# Patient Record
Sex: Male | Born: 1985 | Race: Black or African American | Hispanic: No | Marital: Married | Smoking: Never smoker
Health system: Southern US, Community
[De-identification: ages and names within clinical notes are randomized; demographics above are authoritative.]

## PROBLEM LIST (undated history)

## (undated) DIAGNOSIS — I10 Essential (primary) hypertension: Secondary | ICD-10-CM

## (undated) DIAGNOSIS — J45909 Unspecified asthma, uncomplicated: Secondary | ICD-10-CM

## (undated) HISTORY — DX: Unspecified asthma, uncomplicated: J45.909

## (undated) HISTORY — PX: MOUTH SURGERY: SHX715

---
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
## 2005-02-17 ENCOUNTER — Encounter
Admission: RE | Admit: 2005-02-17 | Discharge: 2005-05-18 | Payer: Self-pay | Admitting: Physical Medicine & Rehabilitation

## 2005-02-21 ENCOUNTER — Ambulatory Visit: Payer: Self-pay | Admitting: Physical Medicine & Rehabilitation

## 2005-05-23 ENCOUNTER — Encounter
Admission: RE | Admit: 2005-05-23 | Discharge: 2005-08-21 | Payer: Self-pay | Admitting: Physical Medicine & Rehabilitation

## 2005-05-23 ENCOUNTER — Ambulatory Visit: Payer: Self-pay | Admitting: Physical Medicine & Rehabilitation
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
## 2006-02-22 ENCOUNTER — Emergency Department (HOSPITAL_COMMUNITY): Admission: EM | Admit: 2006-02-22 | Discharge: 2006-02-22 | Payer: Self-pay | Admitting: Emergency Medicine

## 2006-02-24 ENCOUNTER — Emergency Department (HOSPITAL_COMMUNITY): Admission: EM | Admit: 2006-02-24 | Discharge: 2006-02-24 | Payer: Self-pay | Admitting: Emergency Medicine

## 2006-03-09 ENCOUNTER — Emergency Department (HOSPITAL_COMMUNITY): Admission: EM | Admit: 2006-03-09 | Discharge: 2006-03-09 | Payer: Self-pay | Admitting: Emergency Medicine

## 2007-01-10 DIAGNOSIS — J309 Allergic rhinitis, unspecified: Secondary | ICD-10-CM | POA: Insufficient documentation

## 2007-01-10 DIAGNOSIS — M7520 Bicipital tendinitis, unspecified shoulder: Secondary | ICD-10-CM

## 2007-01-10 DIAGNOSIS — L708 Other acne: Secondary | ICD-10-CM | POA: Insufficient documentation

## 2007-07-19 ENCOUNTER — Encounter: Admission: RE | Admit: 2007-07-19 | Discharge: 2007-07-19 | Payer: Self-pay | Admitting: Family Medicine

## 2007-10-13 ENCOUNTER — Emergency Department (HOSPITAL_COMMUNITY): Admission: EM | Admit: 2007-10-13 | Discharge: 2007-10-13 | Payer: Self-pay | Admitting: Emergency Medicine

## 2008-01-23 ENCOUNTER — Emergency Department (HOSPITAL_COMMUNITY): Admission: EM | Admit: 2008-01-23 | Discharge: 2008-01-23 | Payer: Self-pay | Admitting: Emergency Medicine

## 2008-02-02 ENCOUNTER — Emergency Department (HOSPITAL_COMMUNITY): Admission: EM | Admit: 2008-02-02 | Discharge: 2008-02-02 | Payer: Self-pay | Admitting: Family Medicine

## 2008-06-27 ENCOUNTER — Emergency Department (HOSPITAL_COMMUNITY): Admission: EM | Admit: 2008-06-27 | Discharge: 2008-06-28 | Payer: Self-pay | Admitting: Emergency Medicine

## 2008-09-10 ENCOUNTER — Emergency Department (HOSPITAL_COMMUNITY): Admission: EM | Admit: 2008-09-10 | Discharge: 2008-09-11 | Payer: Self-pay | Admitting: Emergency Medicine

## 2009-04-23 ENCOUNTER — Emergency Department (HOSPITAL_COMMUNITY): Admission: EM | Admit: 2009-04-23 | Discharge: 2009-04-24 | Payer: Self-pay | Admitting: Emergency Medicine

## 2009-09-23 ENCOUNTER — Emergency Department (HOSPITAL_COMMUNITY): Admission: EM | Admit: 2009-09-23 | Discharge: 2009-09-24 | Payer: Self-pay | Admitting: Emergency Medicine

## 2009-09-27 ENCOUNTER — Emergency Department (HOSPITAL_COMMUNITY): Admission: EM | Admit: 2009-09-27 | Discharge: 2009-09-27 | Payer: Self-pay | Admitting: Family Medicine

## 2010-02-15 ENCOUNTER — Emergency Department (HOSPITAL_COMMUNITY): Admission: EM | Admit: 2010-02-15 | Discharge: 2010-02-16 | Payer: Self-pay | Admitting: Emergency Medicine

## 2010-12-04 ENCOUNTER — Encounter: Payer: Self-pay | Admitting: Physical Medicine & Rehabilitation

## 2010-12-18 ENCOUNTER — Ambulatory Visit (HOSPITAL_COMMUNITY)
Admission: RE | Admit: 2010-12-18 | Discharge: 2010-12-18 | Disposition: A | Discharge status: Home / Self Care | Payer: Self-pay | Source: Ambulatory Visit | Attending: Family Medicine | Admitting: Family Medicine

## 2010-12-18 ENCOUNTER — Inpatient Hospital Stay (INDEPENDENT_AMBULATORY_CARE_PROVIDER_SITE_OTHER)
Admission: RE | Admit: 2010-12-18 | Discharge: 2010-12-18 | Disposition: A | Discharge status: Home / Self Care | Payer: Self-pay | Source: Ambulatory Visit | Attending: Family Medicine | Admitting: Family Medicine

## 2010-12-18 DIAGNOSIS — R071 Chest pain on breathing: Secondary | ICD-10-CM

## 2010-12-18 DIAGNOSIS — R079 Chest pain, unspecified: Secondary | ICD-10-CM | POA: Insufficient documentation

## 2012-05-08 ENCOUNTER — Emergency Department (HOSPITAL_COMMUNITY)
Admission: EM | Admit: 2012-05-08 | Discharge: 2012-05-08 | Disposition: A | Discharge status: Home / Self Care | Payer: Self-pay | Attending: Emergency Medicine | Admitting: Emergency Medicine

## 2012-05-08 ENCOUNTER — Encounter (HOSPITAL_COMMUNITY): Payer: Self-pay | Admitting: Emergency Medicine

## 2012-05-08 DIAGNOSIS — R51 Headache: Secondary | ICD-10-CM | POA: Insufficient documentation

## 2012-05-08 DIAGNOSIS — I10 Essential (primary) hypertension: Secondary | ICD-10-CM | POA: Insufficient documentation

## 2012-05-08 HISTORY — DX: Essential (primary) hypertension: I10

## 2012-05-08 MED ORDER — KETOROLAC TROMETHAMINE 60 MG/2ML IM SOLN
60.0000 mg | Freq: Once | INTRAMUSCULAR | Status: AC
  Administered 2012-05-08: 30 mg via INTRAMUSCULAR
  Filled 2012-05-08: qty 2

## 2012-05-08 MED ORDER — METOCLOPRAMIDE HCL 5 MG/ML IJ SOLN
10.0000 mg | Freq: Once | INTRAMUSCULAR | Status: AC
  Administered 2012-05-08: 10 mg via INTRAVENOUS
  Filled 2012-05-08: qty 2

## 2012-05-08 MED ORDER — DIPHENHYDRAMINE HCL 25 MG PO CAPS
25.0000 mg | ORAL_CAPSULE | Freq: Once | ORAL | Status: AC
  Administered 2012-05-08: 25 mg via ORAL
  Filled 2012-05-08: qty 1

## 2012-05-08 MED ORDER — IBUPROFEN 600 MG PO TABS: 600.0000 mg | ORAL_TABLET | Freq: Four times a day (QID) | ORAL | Status: AC | PRN

## 2012-05-08 NOTE — ED Notes (Signed)
Pt states he has had a headache for the past 4 days and states his blood pressure has been fluctuating up and down  Pt states this morning his headache is on the right side around the temporal area making it hard to focus   Pt states he used to take lisinopril for his blood pressure but has not taken it for about 9 mths now  Pt states he quit taking it because he never got the prescription refilled

## 2012-05-08 NOTE — Discharge Instructions (Signed)
Your blood pressure here is normal. Take ibuprofen for pain at home. Make sure you drink plenty of fluids, rest. Follow up with  A primary care doctor or an urgent care for recheck. Return if changes in vision, worsening headache, nausea, vomiting, fever, or any new concerning symptom. You can also try to take zytrec and sudafed for possible sinus congestion  General Headache, Without Cause A general headache has no specific cause. These headaches are not life-threatening. They will not lead to other types of headaches. HOME CARE   Make and keep follow-up visits with your doctor.   Only take medicine as told by your doctor.   Try to relax, get a massage, or use your thoughts to control your body (biofeedback).   Apply cold or heat to the head and neck. Apply 3 or 4 times a day or as needed.  Finding out the results of your test Ask when your test results will be ready. Make sure you get your test results. GET HELP RIGHT AWAY IF:   You have problems with medicine.   Your medicine does not help relieve pain.   Your headache changes or becomes worse.   You feel sick to your stomach (nauseous) or throw up (vomit).   You have a temperature by mouth above 102 F (38.9 C), not controlled by medicine.   Your have a stiff neck.   You have vision loss.   You have muscle weakness.   You lose control of your muscles.   You lose balance or have trouble walking.   You feel like you are going to pass out (faint).  MAKE SURE YOU:   Understand these instructions.   Will watch this condition.   Will get help right away if you are not doing well or get worse.  Document Released: 08/08/2008 Document Revised: 10/19/2011 Document Reviewed: 08/08/2008 [facility] Patient Information 2012 [place], [place].

## 2012-05-08 NOTE — ED Provider Notes (Signed)
History/physical exam/procedure(s) were performed by non-physician practitioner and as supervising physician I was immediately available for consultation/collaboration. I have reviewed all notes and am in agreement with care and plan.   Hilario Quarry, MD 05/08/12 1031

## 2012-05-08 NOTE — ED Notes (Signed)
Report received-airway intact-no s/s's of distress-will continue to monitor 

## 2012-05-08 NOTE — ED Provider Notes (Signed)
History     CSN: [id number]  Arrival date & time 05/08/12  7829   First MD Initiated Contact with Patient 05/08/12 [phone]      Chief Complaint  Patient presents with  . Headache    (Consider location/radiation/quality/duration/timing/severity/associated sxs/prior treatment) Patient is a 26 y.o. male presenting with headaches. The history is provided by the patient.  Headache  This is a new problem. The current episode started more than 2 days ago. The problem occurs constantly. The headache is associated with bright light and loud noise. The pain is located in the right unilateral region. The quality of the pain is described as sharp. The pain is at a severity of 7/10. The pain does not radiate. Pertinent negatives include no fever, no nausea and no vomiting. He has tried nothing for the symptoms.  Pt with pain in the right temple for 4 days. States worsening. States "it feels difficult to focus." Denies visual changes in the right eye. States he is sensitive to light, sound. Denies nasal congestion. Denies head injury. Hx of headaches. States  "my blood pressure has been going up and down." Pt used to take lisinopril, but has been out for 9 months now.   Past Medical History  Diagnosis Date  . Hypertension     Past Surgical History  Procedure Date  . Mouth surgery     Family History  Problem Relation Age of Onset  . Hypertension Other   . Diabetes Other     History  Substance Use Topics  . Smoking status: Never Smoker   . Smokeless tobacco: Not on file  . Alcohol Use: Yes     occassional      Review of Systems  Constitutional: Negative for fever and chills.  HENT: Negative for ear pain, sore throat, neck pain and neck stiffness.   Eyes: Positive for photophobia. Negative for pain, discharge, redness and visual disturbance.  Respiratory: Negative.   Cardiovascular: Negative.   Gastrointestinal: Negative for nausea, vomiting and abdominal pain.  Skin: Negative.     Neurological: Positive for headaches. Negative for dizziness, weakness and numbness.    Allergies  Penicillins  Home Medications  No current outpatient prescriptions on file.  BP 130/73  Pulse 70  Temp 98.6 F (37 C) (Oral)  Resp 18  SpO2 98%  Physical Exam  Nursing note and vitals reviewed. Constitutional: He is oriented to person, place, and time. He appears well-developed and well-nourished. No distress.  HENT:  Head: Normocephalic and atraumatic.  Right Ear: External ear normal.  Left Ear: External ear normal.  Nose: Nose normal.  Mouth/Throat: Oropharynx is clear and moist.  Eyes: Conjunctivae and EOM are normal. Pupils are equal, round, and reactive to light.  Neck: Normal range of motion. Neck supple.  Cardiovascular: Normal rate, regular rhythm and normal heart sounds.   Pulmonary/Chest: Effort normal and breath sounds normal. No respiratory distress. He has no wheezes.  Musculoskeletal: Normal range of motion. He exhibits no edema.  Neurological: He is alert and oriented to person, place, and time.       Normal coordination, finger to nose, heel to shin. 5/5 and equal grip strength bilaterally  Skin: Skin is warm and dry.  Psychiatric: He has a normal mood and affect.    ED Course  Procedures (including critical care time)  Pt is a healthy 25yo with right sided headache. His vs are normal. Neurologically intact. No visual changes. Given toradol, reglan, benadryl with improvement. He is pain free. Suspect  migraine vs tension headache. Will d/c home with close follow up.   Filed Vitals:   05/08/12 0701  BP: 130/73  Pulse: 70  Temp: 98.6 F (37 C)  Resp: 18     1. Headache       MDM         Lottie Mussel, PA 05/08/12 [phone]

## 2012-12-17 ENCOUNTER — Emergency Department (HOSPITAL_COMMUNITY)
Admission: EM | Admit: 2012-12-17 | Discharge: 2012-12-17 | Disposition: A | Discharge status: Home / Self Care | Payer: Self-pay | Attending: Emergency Medicine | Admitting: Emergency Medicine

## 2012-12-17 ENCOUNTER — Encounter (HOSPITAL_COMMUNITY): Payer: Self-pay | Admitting: Emergency Medicine

## 2012-12-17 DIAGNOSIS — K029 Dental caries, unspecified: Secondary | ICD-10-CM | POA: Insufficient documentation

## 2012-12-17 DIAGNOSIS — I10 Essential (primary) hypertension: Secondary | ICD-10-CM | POA: Insufficient documentation

## 2012-12-17 MED ORDER — HYDROCODONE-ACETAMINOPHEN 5-325 MG PO TABS: 1.0000 | ORAL_TABLET | Freq: Four times a day (QID) | ORAL | Status: DC | PRN

## 2012-12-17 MED ORDER — CLINDAMYCIN HCL 150 MG PO CAPS: 150.0000 mg | ORAL_CAPSULE | Freq: Four times a day (QID) | ORAL | Status: DC

## 2012-12-17 NOTE — ED Notes (Signed)
The patient is AOx4 and comfortable with her discharge instructions. 

## 2012-12-17 NOTE — ED Notes (Signed)
Patient complaining of right lower dental pain that started on Saturday.  Last dental visit about a year and a half ago.  Reports swelling at gum line.

## 2012-12-17 NOTE — ED Provider Notes (Signed)
History     CSN: [id number]  Arrival date & time 12/17/12  0211   First MD Initiated Contact with Patient 12/17/12 [phone]      Chief Complaint  Patient presents with  . Dental Pain    (Consider location/radiation/quality/duration/timing/severity/associated sxs/prior treatment) Patient is a 27 y.o. male presenting with tooth pain. The history is provided by the patient. No language interpreter was used.  Dental PainPrimary symptoms do not include fever. The symptoms began 5 to 7 days ago. The symptoms are worsening. The symptoms are recurrent. The symptoms occur constantly.  Additional symptoms include: dental sensitivity to temperature. Additional symptoms do not include: facial swelling and trouble swallowing. Medical issues do not include: smoking.    Past Medical History  Diagnosis Date  . Hypertension     Past Surgical History  Procedure Date  . Mouth surgery     Family History  Problem Relation Age of Onset  . Hypertension Other   . Diabetes Other     History  Substance Use Topics  . Smoking status: Never Smoker   . Smokeless tobacco: Not on file  . Alcohol Use: Yes     Comment: occassional      Review of Systems  Constitutional: Negative for fever.  HENT: Negative for facial swelling, trouble swallowing and neck pain.   All other systems reviewed and are negative.    Allergies  Penicillins  Home Medications  No current outpatient prescriptions on file.  BP 155/97  Pulse 67  Temp 97.8 F (36.6 C) (Oral)  Resp 18  Ht 6' (1.829 m)  Wt 227 lb 8 oz (103.193 kg)  BMI 30.85 kg/m2  SpO2 100%  Physical Exam  Constitutional: He is oriented to person, place, and time. He appears well-developed and well-nourished. No distress.  HENT:  Head: Normocephalic and atraumatic.  Mouth/Throat: Oropharynx is clear and moist.    Eyes: Conjunctivae normal are normal. Pupils are equal, round, and reactive to light.  Neck: Neck supple.  Cardiovascular: Normal  rate and regular rhythm.   Pulmonary/Chest: Effort normal and breath sounds normal. He has no wheezes. He has no rales.  Abdominal: Soft. Bowel sounds are normal. There is no tenderness.  Musculoskeletal: Normal range of motion.  Neurological: He is alert and oriented to person, place, and time.  Skin: Skin is warm.  Psychiatric: He has a normal mood and affect.    ED Course  Procedures (including critical care time)  Labs Reviewed - No data to display No results found.   No diagnosis found.    MDM  Will prescribe clindamycin and pain medication call dentist within 2 days to be seen       Donald Memoli K Samyukta Cura-Rasch, MD 12/17/12 5621

## 2012-12-17 NOTE — ED Notes (Signed)
Advised the patient that, per Dr. Nicanor Alcon, the patient's receive a discount (n.o.s.) from the dentists to whom we referred the patient if the patient sees the D.D.S. W/in 48 hours.

## 2013-04-13 ENCOUNTER — Emergency Department (HOSPITAL_COMMUNITY)
Admission: EM | Admit: 2013-04-13 | Discharge: 2013-04-13 | Disposition: A | Discharge status: Home / Self Care | Payer: Self-pay | Attending: Emergency Medicine | Admitting: Emergency Medicine

## 2013-04-13 ENCOUNTER — Encounter (HOSPITAL_COMMUNITY): Payer: Self-pay | Admitting: *Deleted

## 2013-04-13 DIAGNOSIS — R21 Rash and other nonspecific skin eruption: Secondary | ICD-10-CM

## 2013-04-13 DIAGNOSIS — I10 Essential (primary) hypertension: Secondary | ICD-10-CM | POA: Insufficient documentation

## 2013-04-13 DIAGNOSIS — N4889 Other specified disorders of penis: Secondary | ICD-10-CM | POA: Insufficient documentation

## 2013-04-13 DIAGNOSIS — Z880 Allergy status to penicillin: Secondary | ICD-10-CM | POA: Insufficient documentation

## 2013-04-13 MED ORDER — HYDROCORTISONE 1 % EX CREA: TOPICAL_CREAM | CUTANEOUS | Status: DC

## 2013-04-13 MED ORDER — CLOTRIMAZOLE 1 % EX CREA: TOPICAL_CREAM | CUTANEOUS | Status: DC

## 2013-04-13 NOTE — ED Notes (Signed)
Pt states that he has discoloration, and irritation on his penis. He used [place], summer eve on it

## 2013-04-13 NOTE — ED Provider Notes (Signed)
History    This chart was scribed for non-physician practitioner, Lottie Mussel, PA-C, working with Geoffery Lyons, MD by Melba Coon, ED Scribe. This patient was seen in room TR10C/TR10C and the patient's care was started at 10:41PM.  CSN: [id number]  Arrival date & time 04/13/13  2214   None     Chief Complaint  Patient presents with  . Penis Pain    (Consider location/radiation/quality/duration/timing/severity/associated sxs/prior treatment) The history is provided by the patient. No language interpreter was used.   HPI Comments: Rodney Nunez is a 27 y.o. male who presents to the Emergency Department complaining of constant, moderate to severe, irritating penile pain with discoloration and sores to the penis without swelling with an onset 2 days ago. He reports he used [place] and summer eve soap, and since then has reported penile irritation. He has not tried anything to alleviate the pain. He has no prior history of STDs and does not think his sexual partner has an STD. He has never had these type of symptoms before. He denies pruritis. No abnormal urinary symptoms noted. No other pertinent medical symptoms.  Past Medical History  Diagnosis Date  . Hypertension     Past Surgical History  Procedure Laterality Date  . Mouth surgery      Family History  Problem Relation Age of Onset  . Hypertension Other   . Diabetes Other     History  Substance Use Topics  . Smoking status: Never Smoker   . Smokeless tobacco: Not on file  . Alcohol Use: Yes     Comment: occassional    Review of Systems  Constitutional: Negative for fever and diaphoresis.  HENT: Negative for neck pain and neck stiffness.   Eyes: Negative for visual disturbance.  Respiratory: Negative for apnea, chest tightness and shortness of breath.   Cardiovascular: Negative for chest pain and palpitations.  Gastrointestinal: Negative for nausea, vomiting, diarrhea and constipation.  Genitourinary:  Positive for genital sores and penile pain. Negative for dysuria, urgency, frequency, hematuria, decreased urine volume, discharge, penile swelling, scrotal swelling, difficulty urinating and testicular pain.  Musculoskeletal: Negative for gait problem.  Skin: Negative for rash.  Neurological: Negative for dizziness, weakness, light-headedness, numbness and headaches.  All other systems reviewed and are negative.    Allergies  Penicillins  Home Medications  No current outpatient prescriptions on file.  BP 150/98  Pulse 85  Temp(Src) 99.1 F (37.3 C) (Oral)  Resp 16  SpO2 98%  Physical Exam  Nursing note and vitals reviewed. Constitutional: He is oriented to person, place, and time. He appears well-developed and well-nourished. No distress.  HENT:  Head: Normocephalic.  Eyes: Conjunctivae are normal.  Neck: Normal range of motion. No tracheal deviation present.  Cardiovascular: Normal rate.   Pulmonary/Chest: Effort normal. No respiratory distress.  Abdominal: Soft. There is no tenderness.  Genitourinary: No penile tenderness.  Multiple scaly non tender papules over the shaft of the penis without vesicles, drainage, or penile d/c.  Musculoskeletal: Normal range of motion.  Neurological: He is oriented to person, place, and time.  Skin: Skin is warm. Rash noted. He is not diaphoretic.  Psychiatric: He has a normal mood and affect. His behavior is normal.    ED Course  Procedures (including critical care time)  COORDINATION OF CARE:  10:45PM - Will test for chlamydia and syphilis. Advised to f/u with PCP or health dept if symptoms worsen if tests today are negative and get tested for herpes.   Labs  Reviewed - No data to display No results found.   1. Penile rash       MDM  Pt with dry scaly rash to the penis. Suspect tinea cruris. Rash is non tender, no vesicles, doubt herpes. Did swab for gc/chlamida, rpr. Home with follow up and lotrimin and hydrocortisone  cream.   Filed Vitals:   04/13/13 2221  BP: 150/98  Pulse: 85  Temp: 99.1 F (37.3 C)  TempSrc: Oral  Resp: 16  SpO2: 98%     I personally performed the services described in this documentation, which was scribed in my presence. The recorded information has been reviewed and is accurate.       Lottie Mussel, PA-C 04/14/13 [phone]

## 2013-04-14 LAB — GC/CHLAMYDIA PROBE AMP
CT Probe RNA: NEGATIVE
GC Probe RNA: NEGATIVE

## 2013-04-14 NOTE — ED Provider Notes (Signed)
Medical screening examination/treatment/procedure(s) were performed by non-physician practitioner and as supervising physician I was immediately available for consultation/collaboration.  Geoffery Lyons, MD 04/14/13 [phone]

## 2014-02-05 ENCOUNTER — Telehealth: Payer: Self-pay

## 2014-02-05 NOTE — Telephone Encounter (Signed)
Patient is requesting a release of care form.

## 2014-02-11 ENCOUNTER — Telehealth: Payer: Self-pay | Admitting: Physical Medicine & Rehabilitation

## 2014-02-11 NOTE — Telephone Encounter (Signed)
i cannot make any comment on "release to work" for a patient i last saw 3 years ago. [facility] can do a little research into my last note to see what i had to say back in 2012, as I surely don't!

## 2014-02-11 NOTE — Telephone Encounter (Signed)
Contacted patient to inform him that per Dr. Riley KillSwartz he can not make any comment on releasing him to work because it has been 3 years since he has seen him.  See last telephone encounter.

## 2014-02-11 NOTE — Telephone Encounter (Signed)
Contacted patient to see exactly why he needed to release of care form. He said he was trying to go to in [employer] and they requested it.

## 2014-02-11 NOTE — Telephone Encounter (Signed)
i last saw him 2012??????????is this serious??????????

## 2014-02-24 ENCOUNTER — Ambulatory Visit (INDEPENDENT_AMBULATORY_CARE_PROVIDER_SITE_OTHER): Payer: Managed Care, Other (non HMO) | Admitting: Internal Medicine

## 2014-02-24 ENCOUNTER — Encounter: Payer: Self-pay | Admitting: Internal Medicine

## 2014-02-24 VITALS — BP 124/80 | HR 60 | Temp 97.9°F | Ht 72.0 in | Wt 212.4 lb

## 2014-02-24 DIAGNOSIS — J45909 Unspecified asthma, uncomplicated: Secondary | ICD-10-CM

## 2014-02-24 NOTE — Progress Notes (Signed)
   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 Asthma.  02/24/2014 1st [facility] office visit/ Kyrian Stage  Chief Complaint  Patient presents with  . Pulmonary Consult    Self referral. Pt states has hx of asthma. Not currently having any respiratory co's. Pt needs PFT's and clearance to join the [employer].    Not limited by breathing from desired activities   No problems with uri's/ weather change/ spring fall/ no meds at all  No obvious other patterns in day to day or daytime variabilty or assoc chronic cough or cp or chest tightness, subjective wheeze overt sinus or hb symptoms. No unusual exp hx or h/o childhood pna/ asthma or knowledge of premature birth.  Sleeping ok without nocturnal  or early am exacerbation  of respiratory  c/o's or need for noct saba. Also denies any obvious fluctuation of symptoms with weather or environmental changes or other aggravating or alleviating factors except as outlined above   Current Medications, Allergies, Complete Past Medical History, Past Surgical History, Family History, and Social History were reviewed in [facility] electronic [facility].            Review of Systems  Constitutional: Negative for fever, chills, activity change, appetite change and unexpected weight change.  HENT: Negative for congestion, dental problem, postnasal drip, rhinorrhea, sneezing, sore throat, trouble swallowing and voice change.   Eyes: Negative for visual disturbance.  Respiratory: Negative for cough, choking and shortness of breath.   Cardiovascular: Negative for chest pain and leg swelling.  Gastrointestinal: Negative for nausea, vomiting and abdominal pain.  Genitourinary: Negative  for difficulty urinating.  Musculoskeletal: Negative for arthralgias.  Skin: Negative for rash.  Psychiatric/Behavioral: Negative for behavioral problems and confusion.       Objective:   Physical Exam  amb bm nad  Wt Readings from Last 3 Encounters:  02/24/14 212 lb 6.4 oz (96.344 kg)  12/17/12 227 lb 8 oz (103.193 kg)      HEENT: nl dentition, turbinates, and orophanx. Nl external ear canals without cough reflex   NECK :  without JVD/Nodes/TM/ nl carotid upstrokes bilaterally   LUNGS: no acc muscle use, clear to A and P bilaterally without cough on insp or exp maneuvers   CV:  RRR  no s3 or murmur or increase in P2, no edema   ABD:  soft and nontender with nl excursion in the supine position. No bruits or organomegaly, bowel sounds nl  MS:  warm without deformities, calf tenderness, cyanosis or clubbing  SKIN: warm and dry without lesions    NEURO:  alert, approp, no deficits          Assessment & Plan:

## 2014-02-24 NOTE — Assessment & Plan Note (Signed)
-   Spirometry 02/24/2014 technically wnl though non-specific changes in mid flows are present and computer reads as mild obstruction  No evidence of active asthma at present, may be prone to future events based on h/o childhood asthma and non-specific findings on spirometry but see no reason he couldn't go through basic training or serve in [employer]

## 2014-02-24 NOTE — Patient Instructions (Signed)
There is no evidence you have active asthma at present -  although you do have a history of child hood asthma so there's no way to prove you won't ever have it but in my opinion you should be able to do all physical activities required to go through training and serve in the [employer]

## 2014-05-16 ENCOUNTER — Encounter (HOSPITAL_COMMUNITY): Payer: Self-pay | Admitting: Emergency Medicine

## 2014-05-16 ENCOUNTER — Emergency Department (HOSPITAL_COMMUNITY)
Admission: EM | Admit: 2014-05-16 | Discharge: 2014-05-16 | Disposition: A | Discharge status: Home / Self Care | Payer: Managed Care, Other (non HMO) | Attending: Emergency Medicine | Admitting: Emergency Medicine

## 2014-05-16 DIAGNOSIS — Z79899 Other long term (current) drug therapy: Secondary | ICD-10-CM | POA: Insufficient documentation

## 2014-05-16 DIAGNOSIS — M545 Low back pain, unspecified: Secondary | ICD-10-CM | POA: Insufficient documentation

## 2014-05-16 DIAGNOSIS — Z791 Long term (current) use of non-steroidal anti-inflammatories (NSAID): Secondary | ICD-10-CM | POA: Insufficient documentation

## 2014-05-16 DIAGNOSIS — J45909 Unspecified asthma, uncomplicated: Secondary | ICD-10-CM | POA: Insufficient documentation

## 2014-05-16 DIAGNOSIS — Z880 Allergy status to penicillin: Secondary | ICD-10-CM | POA: Insufficient documentation

## 2014-05-16 DIAGNOSIS — I10 Essential (primary) hypertension: Secondary | ICD-10-CM | POA: Insufficient documentation

## 2014-05-16 MED ORDER — HYDROCODONE-ACETAMINOPHEN 5-325 MG PO TABS: 1.0000 | ORAL_TABLET | Freq: Four times a day (QID) | ORAL | Status: DC | PRN

## 2014-05-16 MED ORDER — KETOROLAC TROMETHAMINE 30 MG/ML IJ SOLN
30.0000 mg | Freq: Once | INTRAMUSCULAR | Status: AC
  Administered 2014-05-16: 30 mg via INTRAMUSCULAR
  Filled 2014-05-16: qty 1

## 2014-05-16 MED ORDER — IBUPROFEN 800 MG PO TABS: 800.0000 mg | ORAL_TABLET | Freq: Three times a day (TID) | ORAL | Status: DC

## 2014-05-16 MED ORDER — DIAZEPAM 5 MG PO TABS: 5.0000 mg | ORAL_TABLET | Freq: Two times a day (BID) | ORAL | Status: DC

## 2014-05-16 NOTE — ED Notes (Signed)
Pt c/o back spasms to lower back since this morning. Pt states he has had back pain for the past month. Pt ambulatory to exam room with steady gait.

## 2014-05-16 NOTE — ED Provider Notes (Signed)
CSN: [id number]     Arrival date & time 05/16/14  1519 History   First MD Initiated Contact with Patient 05/16/14 1537     Chief Complaint  Patient presents with  . Back Spasms       HPI  Patient p/w low back pain. Pain began approximately one month ago without clear precipitant. Initially the pain was mild, but over the past day has become more severe, crampy, bilateral, worse on the left. No no distal dysesthesia or weakness, no incontinence, fevers, chills, abdominal pain no urinary complaints.  No relief with Flexeril.    Past Medical History  Diagnosis Date  . Hypertension   . Asthma    Past Surgical History  Procedure Laterality Date  . Mouth surgery     Family History  Problem Relation Age of Onset  . Heart attack Father   . Hypertension Sister   . Hypertension Mother    History  Substance Use Topics  . Smoking status: Never Smoker   . Smokeless tobacco: Never Used  . Alcohol Use: Yes     Comment: occassional    Patient works lifting heavy objects.    Review of Systems  Constitutional:       Per HPI, otherwise negative  HENT:       Per HPI, otherwise negative  Respiratory:       Per HPI, otherwise negative  Cardiovascular:       Per HPI, otherwise negative  Gastrointestinal: Negative for vomiting.  Endocrine: Negative for polyuria.  Genitourinary:       Neg aside from HPI   Musculoskeletal:       Per HPI, otherwise negative  Skin: Negative.   Neurological: Positive for weakness and numbness. Negative for syncope.      Allergies  Penicillins  Home Medications   Prior to Admission medications   Medication Sig Start Date End Date Taking? Authorizing Provider  diazepam (VALIUM) 5 MG tablet Take 1 tablet (5 mg total) by mouth 2 (two) times daily. 05/16/14   Gerhard Munchobert Fenna Semel, MD  HYDROcodone-acetaminophen (NORCO/VICODIN) 5-325 MG per tablet Take 1 tablet by mouth every 6 (six) hours as needed for severe pain. 05/16/14   Gerhard Munchobert Jernie Schutt, MD   ibuprofen (ADVIL,MOTRIN) 800 MG tablet Take 1 tablet (800 mg total) by mouth 3 (three) times daily. 05/16/14   Gerhard Munchobert Tyreona Panjwani, MD   BP 118/72  Pulse 58  Temp(Src) 97.9 F (36.6 C) (Oral)  Resp 16  SpO2 98% Physical Exam  Nursing note and vitals reviewed. Constitutional: He is oriented to person, place, and time. He appears well-developed. No distress.  HENT:  Head: Normocephalic and atraumatic.  Eyes: Conjunctivae and EOM are normal.  Cardiovascular: Normal rate and regular rhythm.   Pulmonary/Chest: Effort normal. No stridor. No respiratory distress.  Abdominal: He exhibits no distension.  Musculoskeletal: He exhibits no edema.  Neurological: He is alert and oriented to person, place, and time. No cranial nerve deficit. He exhibits normal muscle tone. Coordination normal.  Skin: Skin is warm and dry.  Psychiatric: He has a normal mood and affect.    ED Course  Procedures (including critical care time)  MDM   Final diagnoses:  Low back pain at multiple sites   well-appearing male with no neurologic deficits presents with worsening low back pain.  Patient was discharged in stable condition after initiation of analgesic regimen.      Gerhard Munchobert Pharrah Rottman, MD 05/16/14 [phone]

## 2014-05-16 NOTE — Discharge Instructions (Signed)
As discussed, your evaluation today has been largely reassuring.  But, it is important that you monitor your condition carefully, and do not hesitate to return to the ED if you develop new, or concerning changes in your condition. ? ?Otherwise, please follow-up with your physician for appropriate ongoing care. ? ?

## 2014-10-13 ENCOUNTER — Ambulatory Visit (INDEPENDENT_AMBULATORY_CARE_PROVIDER_SITE_OTHER): Payer: Managed Care, Other (non HMO)

## 2014-10-13 ENCOUNTER — Ambulatory Visit (INDEPENDENT_AMBULATORY_CARE_PROVIDER_SITE_OTHER): Payer: Managed Care, Other (non HMO) | Admitting: Family Medicine

## 2014-10-13 VITALS — BP 134/82 | HR 61 | Temp 98.9°F | Resp 16 | Ht 70.75 in | Wt 211.0 lb

## 2014-10-13 DIAGNOSIS — M5440 Lumbago with sciatica, unspecified side: Secondary | ICD-10-CM

## 2014-10-13 DIAGNOSIS — R202 Paresthesia of skin: Secondary | ICD-10-CM

## 2014-10-13 DIAGNOSIS — Z1329 Encounter for screening for other suspected endocrine disorder: Secondary | ICD-10-CM

## 2014-10-13 DIAGNOSIS — M542 Cervicalgia: Secondary | ICD-10-CM

## 2014-10-13 DIAGNOSIS — Z131 Encounter for screening for diabetes mellitus: Secondary | ICD-10-CM

## 2014-10-13 LAB — POCT CBC
Granulocyte percent: 47.7 %G (ref 37–80)
HCT, POC: 44.5 % (ref 43.5–53.7)
Hemoglobin: 14.8 g/dL (ref 14.1–18.1)
Lymph, poc: 1.8 (ref 0.6–3.4)
MCH, POC: 28.9 pg (ref 27–31.2)
MCHC: 33.3 g/dL (ref 31.8–35.4)
MCV: 87 fL (ref 80–97)
MID (cbc): 0.3 (ref 0–0.9)
MPV: 8.1 fL (ref 0–99.8)
POC Granulocyte: 1.9 — AB (ref 2–6.9)
POC LYMPH PERCENT: 45.3 %L (ref 10–50)
POC MID %: 7 %M (ref 0–12)
Platelet Count, POC: 269 10*3/uL (ref 142–424)
RBC: 5.11 M/uL (ref 4.69–6.13)
RDW, POC: 13.2 %
WBC: 3.9 10*3/uL — AB (ref 4.6–10.2)

## 2014-10-13 LAB — POCT GLYCOSYLATED HEMOGLOBIN (HGB A1C): Hemoglobin A1C: 5.2

## 2014-10-13 MED ORDER — TIZANIDINE HCL 4 MG PO TABS: 4.0000 mg | ORAL_TABLET | Freq: Three times a day (TID) | ORAL | Status: DC | PRN

## 2014-10-13 MED ORDER — IBUPROFEN 800 MG PO TABS: 800.0000 mg | ORAL_TABLET | Freq: Three times a day (TID) | ORAL | Status: DC | PRN

## 2014-10-13 NOTE — Progress Notes (Signed)
Chief Complaint:  Chief Complaint  Patient presents with  . Numbness    Bilateral Legs & Feet     HPI: Rodney Nunez is a 28 y.o. male who is here for  Bilateral feet numbness and tingling Has had no fevers or chills, denies diabetes or nay msk abnormalities Sometimes has it in his hands as well, there is no discolorations, no weather dependent When he is standing for a long time, when he is sitting for a long time the numbness and tingling will come on  When he is laying down it is worse  His mom has DM and thyroid disease. He has been  Urinating more, he has been drinking sodas to keep his energy level and sleep deprivation at bay He has intentionally lost weight. 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  He is on his feet all the time. 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   Thursday through Sunday 7-8  hours of sleep, MOnday -Wednesday 4 hours of sleep Weekends he sleeps normally Thursday through Sunday  He sleeps about 4 hours.   Wt Readings from Last 3 Encounters:  10/13/14 211 lb (95.709 kg)  02/24/14 212 lb 6.4 oz (96.344 kg)  12/17/12 227 lb 8 oz (103.193 kg)     Past Medical History  Diagnosis Date  . Hypertension   . Asthma    Past Surgical History  Procedure Laterality Date  . Mouth surgery     History   Social History  . Marital Status: Single    Spouse Name: N/A    Number of Children: N/A  . Years of Education: N/A   Social History Main Topics  . Smoking status: Never Smoker   . Smokeless tobacco: Never Used  . Alcohol Use: Yes     Comment: occassional  . Drug Use: No  . Sexual Activity: None   Other Topics Concern  . None   Social History Narrative   Family History  Problem Relation Age of Onset  . Heart attack Father   . Heart  disease Father   . Hypertension Sister   . Hypertension Mother   . Diabetes Mother    Allergies  Allergen Reactions  . Penicillins Nausea And Vomiting   Prior to Admission medications   Medication Sig Start Date End Date Taking? Authorizing Provider  diazepam (VALIUM) 5 MG tablet Take 1 tablet (5 mg total) by mouth 2 (two) times daily. Patient not taking: Reported on 10/13/2014 05/16/14   Gerhard Munchobert Lockwood, MD  HYDROcodone-acetaminophen (NORCO/VICODIN) 5-325 MG per tablet Take 1 tablet by mouth every 6 (six) hours as needed for severe pain. Patient not taking: Reported on 10/13/2014 05/16/14   Gerhard Munchobert Lockwood, MD  ibuprofen (ADVIL,MOTRIN) 800 MG tablet Take 1 tablet (800 mg total) by mouth 3 (three) times daily. Patient not taking: Reported on 10/13/2014 05/16/14   Gerhard Munchobert Lockwood, MD  tiZANidine (ZANAFLEX) 4 MG tablet Take 4 mg by mouth every 8 (eight) hours as needed for muscle spasms.    Historical Provider, MD     ROS: The patient denies fevers, chills, night sweats, unintentional weight loss, chest pain, palpitations, wheezing, dyspnea on exertion, nausea, vomiting, abdominal pain, dysuria,  hematuria, melena,  All other systems have been reviewed and were otherwise negative with the exception of those mentioned in the HPI and as above.    PHYSICAL EXAM: Filed Vitals:   10/13/14 1854  BP: 134/82  Pulse: 61  Temp: 98.9 F (37.2 C)  Resp: 16   Filed Vitals:   10/13/14 1854  Height: 5' 10.75" (1.797 m)  Weight: 211 lb (95.709 kg)   Body mass index is 29.64 kg/(m^2).  General: Alert, no acute distress HEENT:  Normocephalic, atraumatic, oropharynx patent. EOMI, PERRLA. fundo exam normal Cardiovascular:  Regular rate and rhythm, no rubs murmurs or gallops.  No Carotid bruits, radial pulse intact. No pedal edema.  Respiratory: Clear to auscultation bilaterally.  No wheezes, rales, or rhonchi.  No cyanosis, no use of accessory musculature GI: No organomegaly, abdomen is soft and  non-tender, positive bowel sounds.  No masses. Skin: No rashes. Neurologic: Facial musculature symmetric. Psychiatric: Patient is appropriate throughout our interaction. Lymphatic: No cervical lymphadenopathy Musculoskeletal: Gait intact. neck exam normal except for +spurling, full ROM and strength Low back- +midline and  paramsk tenderness  tednerness of low back Full ROM 5/5 strength, 2/2 DTRs No saddle anesthesia Straight leg negative Hip and knee exam--normal    LABS: Results for orders placed or performed in visit on 10/13/14  POCT CBC  Result Value Ref Range   WBC 3.9 (A) 4.6 - 10.2 K/uL   Lymph, poc 1.8 0.6 - 3.4   POC LYMPH PERCENT 45.3 10 - 50 %L   MID (cbc) 0.3 0 - 0.9   POC MID % 7.0 0 - 12 %M   POC Granulocyte 1.9 (A) 2 - 6.9   Granulocyte percent 47.7 37 - 80 %G   RBC 5.11 4.69 - 6.13 M/uL   Hemoglobin 14.8 14.1 - 18.1 g/dL   HCT, POC 11.944.5 14.743.5 - 53.7 %   MCV 87.0 80 - 97 fL   MCH, POC 28.9 27 - 31.2 pg   MCHC 33.3 31.8 - 35.4 g/dL   RDW, POC 82.913.2 %   Platelet Count, POC 269 142 - 424 K/uL   MPV 8.1 0 - 99.8 fL  POCT glycosylated hemoglobin (Hb A1C)  Result Value Ref Range   Hemoglobin A1C 5.2      EKG/XRAY:   Primary [facility]. Neg fx or dislocation for c spine or l spine   ASSESSMENT/PLAN: Encounter Diagnoses  Name Primary?  . Paresthesia of left arm and leg Yes  . Paresthesia of right arm and leg   . Midline low back pain with sciatica, sciatica laterality unspecified   . Screening for thyroid disorder   . Screening for diabetes mellitus (DM)    Most likely repetitve motion, he is on he is feet all day  lifting boxes He will try ibuprofen He wiil be rx Flexeril to use pern  Labs pending F/u prn Work note given   Gross sideeffects, risk and benefits, and alternatives of medications d/w patient. Patient is aware that all medications have potential sideeffects and we are unable to predict every sideeffect or  drug-drug interaction that may occur.  Katha Kuehne PHUONG, DO 10/13/2014 8:40 PM

## 2014-10-14 LAB — TSH: TSH: 5.291 u[IU]/mL — ABNORMAL HIGH (ref 0.350–4.500)

## 2014-10-14 LAB — COMPLETE METABOLIC PANEL WITHOUT GFR
ALT: 20 U/L (ref 0–53)
AST: 29 U/L (ref 0–37)
Albumin: 4.6 g/dL (ref 3.5–5.2)
Alkaline Phosphatase: 51 U/L (ref 39–117)
BUN: 12 mg/dL (ref 6–23)
CO2: 30 meq/L (ref 19–32)
Calcium: 10 mg/dL (ref 8.4–10.5)
Chloride: 102 meq/L (ref 96–112)
Creat: 1.08 mg/dL (ref 0.50–1.35)
GFR, Est African American: >89 mL/min
GFR, Est Non African American: >89 mL/min
Glucose, Bld: 85 mg/dL (ref 70–99)
Potassium: 4.7 meq/L (ref 3.5–5.3)
Sodium: 140 meq/L (ref 135–145)
Total Bilirubin: 1.1 mg/dL (ref 0.2–1.2)
Total Protein: 7.4 g/dL (ref 6.0–8.3)

## 2014-10-20 ENCOUNTER — Telehealth: Payer: Self-pay | Admitting: Family Medicine

## 2014-10-20 ENCOUNTER — Encounter: Payer: Self-pay | Admitting: Family Medicine

## 2014-10-20 NOTE — Telephone Encounter (Signed)
Spoke with patient about labs, his TSH was slightly high, I would like him to come in in about 1 montha nd get it rechecked and also see if his aches and pains are gone, he states the medicines make him feel better.

## 2014-12-02 ENCOUNTER — Ambulatory Visit (INDEPENDENT_AMBULATORY_CARE_PROVIDER_SITE_OTHER): Payer: Managed Care, Other (non HMO) | Admitting: Family Medicine

## 2014-12-02 VITALS — BP 120/82 | HR 58 | Temp 97.4°F | Resp 18 | Ht 72.0 in | Wt 217.0 lb

## 2014-12-02 DIAGNOSIS — G43001 Migraine without aura, not intractable, with status migrainosus: Secondary | ICD-10-CM

## 2014-12-02 MED ORDER — HYDROCODONE-ACETAMINOPHEN 5-325 MG PO TABS: 1.0000 | ORAL_TABLET | Freq: Four times a day (QID) | ORAL | Status: DC | PRN

## 2014-12-02 NOTE — Progress Notes (Signed)
° °  Subjective:    Patient ID: Rodney FenderReginald D Tourville, male    DOB: 08-31-1986, 29 y.o.   MRN: [id number] This chart was scribed for Elvina SidleKurt Lauenstein, MD by Jolene Provostobert Halas, Medical Scribe. This patient was seen in Room 9 and the patient's care was started a 9:16 AM.  Chief Complaint  Patient presents with   Headache   Dizziness    HPI HPI Comments: Rodney Nunez is a 29 y.o. male with a past hx of HA who [facility] complaining of severe HA that started last night. Pt denies sinus congestion. Pt endorses dizziness, blurred vision. Pt denies photophobia, nausea, vomiting. Pt states he has not taken any medication.     Review of Systems  Constitutional: Negative for fever and chills.  Musculoskeletal: Negative for myalgias and arthralgias.  Neurological: Positive for dizziness and headaches. Negative for weakness and numbness.  Psychiatric/Behavioral: Positive for sleep disturbance.       Objective:   Physical Exam  Constitutional: He is oriented to person, place, and time. He appears well-developed and well-nourished.  HENT:  Head: Normocephalic and atraumatic.  Eyes: Pupils are equal, round, and reactive to light.  Neck: Neck supple.  Cardiovascular: Normal rate and regular rhythm.   Pulmonary/Chest: Effort normal and breath sounds normal. No respiratory distress.  Neurological: He is alert and oriented to person, place, and time.  Skin: Skin is warm and dry.  Psychiatric: He has a normal mood and affect. His behavior is normal.  Nursing note and vitals reviewed.  Fundi: Normal Neurological: Normal reflexes, moving 4 extremities equally, EOM intact, neck supple    Assessment & Plan:   This chart was scribed in my presence and reviewed by me personally.    ICD-9-CM ICD-10-CM   1. Migraine without aura and with status migrainosus, not intractable 346.12 G43.001 HYDROcodone-acetaminophen (NORCO) 5-325 MG per tablet     Signed, Elvina SidleKurt Lauenstein, MD

## 2014-12-02 NOTE — Patient Instructions (Signed)

## 2015-03-05 ENCOUNTER — Encounter: Payer: Self-pay | Admitting: Internal Medicine

## 2015-03-05 ENCOUNTER — Ambulatory Visit (INDEPENDENT_AMBULATORY_CARE_PROVIDER_SITE_OTHER): Payer: Managed Care, Other (non HMO) | Admitting: Internal Medicine

## 2015-03-05 ENCOUNTER — Encounter (INDEPENDENT_AMBULATORY_CARE_PROVIDER_SITE_OTHER): Payer: Self-pay

## 2015-03-05 DIAGNOSIS — J45909 Unspecified asthma, uncomplicated: Secondary | ICD-10-CM | POA: Diagnosis not present

## 2015-03-05 NOTE — Progress Notes (Signed)
Subjective:    Patient ID: Rodney Nunez, male    DOB: 1986/05/22   MRN: [id number]   Brief patient profile:  28 yobm never smoker  with childhood asthma used inhaler before exercise until 4th grade but nothing needed after that no trouble playing varsity football linebacker / full court basketball applying for [employer] so self referred 02/24/2014 for ? Asthma.   History of Present Illness  02/24/2014 [facility] visit/ [facility]  Chief Complaint  Patient presents with  . Pulmonary Consult    Self referral. Pt states has hx of asthma. Not currently having any respiratory co's. Pt needs PFT's and clearance to join the [employer].    Not limited by breathing from desired activities   No problems with uri's/ weather change/ spring fall/ no meds at all - out door bball ok  rec there is no evidence you have active asthma at present -  although you do have a history of child hood asthma so there's no way to prove you won't ever have it but in my opinion you should be able to do all physical activities required to go through training and serve in the [employer]   03/05/2015 f/u ov/[facility] re: reassessment re asthma Chief Complaint  Patient presents with  . Follow-up    Pt states needing breathing test done in order to join the [employer]. He states that his breathing is doing well and denies any co's today.      Can play outdoor sports in all conditions s cough /wheeze/ sob    No obvious day to day or daytime variabilty or assoc chronic cough or cp or chest tightness, subjective wheeze overt sinus or hb symptoms. No unusual exp hx or h/o childhood pna/ asthma or knowledge of premature birth.  Sleeping ok without nocturnal  or early am exacerbation  of respiratory  c/o's or need for noct saba. Also denies any obvious fluctuation of symptoms with weather or environmental changes or other aggravating or alleviating factors except as outlined above   Current Medications, Allergies, Complete Past  Medical History, Past Surgical History, Family History, and Social History were reviewed in [facility] electronic medical record.  ROS  The following are not active complaints unless bolded sore throat, dysphagia, dental problems, itching, sneezing,  nasal congestion or excess/ purulent secretions, ear ache,   fever, chills, sweats, unintended wt loss, pleuritic or exertional cp, hemoptysis,  orthopnea pnd or leg swelling, presyncope, palpitations, heartburn, abdominal pain, anorexia, nausea, vomiting, diarrhea  or change in bowel or urinary habits, change in stools or urine, dysuria,hematuria,  rash, arthralgias, visual complaints, headache, numbness weakness or ataxia or problems with walking or coordination,  change in mood/affect or memory.                      Objective:   Physical Exam  amb bm nad  Wt Readings from Last 3 Encounters:  03/05/15 230 lb (104.327 kg)  12/02/14 217 lb (98.431 kg)  10/13/14 211 lb (95.709 kg)    Vital signs reviewed       HEENT: nl dentition, turbinates, and orophanx. Nl external ear canals without cough reflex   NECK :  without JVD/Nodes/TM/ nl carotid upstrokes bilaterally   LUNGS: no acc muscle use, clear to A and P bilaterally without cough on insp or exp maneuvers   CV:  RRR  no s3 or murmur or increase in P2, no edema   ABD:  soft and nontender  with nl excursion in the supine position. No bruits or organomegaly, bowel sounds nl  MS:  warm without deformities, calf tenderness, cyanosis or clubbing  SKIN: warm and dry without lesions    NEURO:  alert, approp, no deficits          Assessment & Plan:

## 2015-03-05 NOTE — Patient Instructions (Signed)
Your test is consistent with someone who had asthma as a child with a fixed pattern of airfow obstruction but not active asthma  If more information is needed, you will need to schedule a CPST with spirometry before and after exercise to see if you have any asthma related to exertion - call Libby at [phone]

## 2015-03-06 ENCOUNTER — Encounter: Payer: Self-pay | Admitting: Internal Medicine

## 2015-03-06 NOTE — Assessment & Plan Note (Signed)
-   Spirometry 02/24/2014 technically wnl though non-specific changes in mid flows are present and computer reads as mild obstruction - Spirometry 03/05/15 min airflow obst  Clinically he has no asthma not with colds or ex in all conditions though his pfts do suggest min airflow obst and I do suspect he would go on to develop copd if smoked is not at this point active asthmatic  May do cpst to establish absence of asthma with exercise if needed.

## 2015-08-31 ENCOUNTER — Ambulatory Visit: Payer: Worker's Compensation

## 2015-08-31 ENCOUNTER — Ambulatory Visit (INDEPENDENT_AMBULATORY_CARE_PROVIDER_SITE_OTHER): Payer: Worker's Compensation | Admitting: Internal Medicine

## 2015-08-31 VITALS — BP 110/64 | HR 60 | Temp 97.7°F | Resp 16 | Ht 72.0 in | Wt 225.0 lb

## 2015-08-31 DIAGNOSIS — M25561 Pain in right knee: Secondary | ICD-10-CM | POA: Diagnosis not present

## 2015-08-31 DIAGNOSIS — M7041 Prepatellar bursitis, right knee: Secondary | ICD-10-CM

## 2015-08-31 MED ORDER — NAPROXEN SODIUM 550 MG PO TABS: 550.0000 mg | ORAL_TABLET | Freq: Two times a day (BID) | ORAL | Status: DC

## 2015-08-31 NOTE — Patient Instructions (Signed)
Apply ice, keep wrapped Take anaprox twice a day for 7-10 days. Do not use with other products containing ibuprofen, naprosyn or aspirin. You may use tylenol with this medication. Elevate leg when possible Light duty at work without bending or kneeling Return in 1 week for follow up.   RICE for Routine Care of Injuries Theroutine careofmanyinjuriesincludes rest, ice, compression, and elevation (RICE therapy). RICE therapy is often recommended for injuries to soft tissues, such as a muscle strain, ligament injuries, bruises, and overuse injuries. It can also be used for some bony injuries. Using RICE therapy can help to relieve pain, lessen swelling, and enable your body to heal. Rest Rest is required to allow your body to heal. This usually involves reducing your normal activities and avoiding use of the injured part of your body. Generally, you can return to your normal activities when you are comfortable and have been given permission by your health care provider. Ice Icing your injury helps to keep the swelling down, and it lessens pain. Do not apply ice directly to your skin.  Put ice in a plastic bag.  Place a towel between your skin and the bag.  Leave the ice on for 20 minutes, 2-3 times a day. Do this for as long as you are directed by your health care provider. Compression Compression means putting pressure on the injured area. Compression helps to keep swelling down, gives support, and helps with discomfort. Compression may be done with an elastic bandage. If an elastic bandage has been applied, follow these general tips:  Remove and reapply the bandage every 3-4 hours or as directed by your health care provider.  Make sure the bandage is not wrapped too tightly, because this can cut off circulation. If part of your body beyond the bandage becomes blue, numb, cold, swollen, or more painful, your bandage is most likely too tight. If this occurs, remove your bandage and reapply  it more loosely.  See your health care provider if the bandage seems to be making your problems worse rather than better. Elevation Elevation means keeping the injured area raised. This helps to lessen swelling and decrease pain. If possible, your injured area should be elevated at or above the level of your heart or the center of your chest. WHEN SHOULD I SEEK MEDICAL CARE? You should seek medical care if:  Your pain and swelling continue.  Your symptoms are getting worse rather than improving. These symptoms may indicate that further evaluation or further X-rays are needed. Sometimes, X-rays may not show a small broken bone (fracture) until a number of days later. Make a follow-up appointment with your health care provider. WHEN SHOULD I SEEK IMMEDIATE MEDICAL CARE? You should seek immediate medical care if:  You have sudden severe pain at or below the area of your injury.  You have redness or increased swelling around your injury.  You have tingling or numbness at or below the area of your injury that does not improve after you remove the elastic bandage.   This information is not intended to replace advice given to you by your health care provider. Make sure you discuss any questions you have with your health care provider.   Document Released: 02/11/2001 Document Revised: 07/21/2015 Document Reviewed: 10/07/2014 Elsevier Interactive Patient Education [employer]2016 Elsevier Inc.

## 2015-08-31 NOTE — Progress Notes (Signed)
[facility] [address], [place] [address] [phone]  Date:  08/31/2015   Name:  Rodney Nunez   DOB:  03/18/1986   MRN:  [id number]  PCP:  Temple PaciniBONSU, OSEI A, DO    Chief Complaint: Knee Injury   History of Present Illness:  This is a 29 y.o. male who is presenting with a right knee injury that occurred at work on 08/29/15. 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 He tripped over a piece of equipment and fell onto right anterior knee. He has had pain and swelling since. He is able to ambulate with slight pain. Swollen area is tender to touch. He denies redness of skin, fever, chills. He has never had anything like this before. He denies weakness or paresthesias.  Review of Systems:  Review of Systems See HPI  Prior to Admission medications   Not on File    Allergies  Allergen Reactions  . Penicillins Nausea And Vomiting    Medication list has been reviewed and updated.  Physical Examination:  Physical Exam  Constitutional: He is oriented to person, place, and time. He appears well-developed and well-nourished. No distress.  HENT:  Head: Normocephalic and atraumatic.  Right Ear: Hearing normal.  Left Ear: Hearing normal.  Nose: Nose normal.  Eyes: Conjunctivae and lids are normal. Right eye exhibits no discharge. Left eye exhibits no discharge. No scleral icterus.  Cardiovascular: Normal rate, regular rhythm and normal pulses.   Pulmonary/Chest: Effort normal. No respiratory distress.  Musculoskeletal: Normal range of motion.       Right knee: He exhibits swelling (3 cm fluctuance over inferior patellar tendon consistent with bursitis). He exhibits normal range of motion, no effusion, no ecchymosis, no laceration and no erythema. Tenderness found. Lateral joint line and patellar tendon tenderness noted. No medial joint line, no MCL and no LCL tenderness noted.       Left knee: Normal.  Neurological: He is alert and oriented to  person, place, and time. He has normal strength. No sensory deficit. Gait normal.  Skin: Skin is warm, dry and intact. No lesion and no rash noted.  Psychiatric: He has a normal mood and affect. His speech is normal and behavior is normal. Thought content normal.   BP 110/64 mmHg  Pulse 60  Temp(Src) 97.7 F (36.5 C) (Oral)  Resp 16  Ht 6' (1.829 m)  Wt 225 lb (102.059 kg)  BMI 30.51 kg/m2  SpO2 99%  [facility] reading (PRIMARY) by  Dr. Perrin MalteseGuest: inferior patellar lucency otherwise negative.  Assessment and Plan:  1. Prepatellar bursitis, right 2. Knee pain, right Pt will stay out of work until 10/23 to rest and limit mobilization. He will return to work on 10/23 with restrictions on bending, kneeling and prolonged standing. Anaprox BID x 7-10 days. Counseled on RICE. Return on 09/07/15 for recheck. - naproxen sodium (ANAPROX DS) 550 MG tablet; Take 1 tablet (550 mg total) by mouth 2 (two) times daily with a meal.  Dispense: 30 tablet; Refill: 0 - DG Knee Complete 4 Views Right; Future   Roswell MinersNicole V. Dyke BrackettBush, PA-C, MHS [facility]  08/31/2015

## 2015-09-07 ENCOUNTER — Ambulatory Visit (INDEPENDENT_AMBULATORY_CARE_PROVIDER_SITE_OTHER): Payer: Worker's Compensation | Admitting: Internal Medicine

## 2015-09-07 VITALS — BP 108/78 | HR 60 | Temp 97.7°F | Resp 18 | Ht 72.0 in | Wt 229.0 lb

## 2015-09-07 DIAGNOSIS — S8001XD Contusion of right knee, subsequent encounter: Secondary | ICD-10-CM | POA: Diagnosis not present

## 2015-09-07 DIAGNOSIS — M25561 Pain in right knee: Secondary | ICD-10-CM | POA: Diagnosis not present

## 2015-09-07 NOTE — Patient Instructions (Signed)
Generic Knee Exercises EXERCISES RANGE OF MOTION (ROM) AND STRETCHING EXERCISES These exercises may help you when beginning to rehabilitate your injury. Your symptoms may resolve with or without further involvement from your physician, physical therapist, or athletic trainer. While completing these exercises, remember:   Restoring tissue flexibility helps normal motion to return to the joints. This allows healthier, less painful movement and activity.  An effective stretch should be held for at least 30 seconds.  A stretch should never be painful. You should only feel a gentle lengthening or release in the stretched tissue. STRETCH - Knee Extension, Prone  Lie on your stomach on a firm surface, such as a bed or countertop. Place your right / left knee and leg just beyond the edge of the surface. You may wish to place a towel under the far end of your right / left thigh for comfort.  Relax your leg muscles and allow gravity to straighten your knee. Your clinician may advise you to add an ankle weight if more resistance is helpful for you.  You should feel a stretch in the back of your right / left knee. Hold this position for __________ seconds. Repeat __________ times. Complete this stretch __________ times per day. * Your physician, physical therapist, or athletic trainer may ask you to add ankle weight to enhance your stretch.  RANGE OF MOTION - Knee Flexion, Active  Lie on your back with both knees straight. (If this causes back discomfort, bend your opposite knee, placing your foot flat on the floor.)  Slowly slide your heel back toward your buttocks until you feel a gentle stretch in the front of your knee or thigh.  Hold for __________ seconds. Slowly slide your heel back to the starting position. Repeat __________ times. Complete this exercise __________ times per day.  STRETCH - Quadriceps, Prone   Lie on your stomach on a firm surface, such as a bed or padded floor.  Bend your  right / left knee and grasp your ankle. If you are unable to reach your ankle or pant leg, use a belt around your foot to lengthen your reach.  Gently pull your heel toward your buttocks. Your knee should not slide out to the side. You should feel a stretch in the front of your thigh and/or knee.  Hold this position for __________ seconds. Repeat __________ times. Complete this stretch __________ times per day.  STRETCH - Hamstrings, Supine   Lie on your back. Loop a belt or towel over the ball of your right / left foot.  Straighten your right / left knee and slowly pull on the belt to raise your leg. Do not allow the right / left knee to bend. Keep your opposite leg flat on the floor.  Raise the leg until you feel a gentle stretch behind your right / left knee or thigh. Hold this position for __________ seconds. Repeat __________ times. Complete this stretch __________ times per day.  STRENGTHENING EXERCISES These exercises may help you when beginning to rehabilitate your injury. They may resolve your symptoms with or without further involvement from your physician, physical therapist, or athletic trainer. While completing these exercises, remember:   Muscles can gain both the endurance and the strength needed for everyday activities through controlled exercises.  Complete these exercises as instructed by your physician, physical therapist, or athletic trainer. Progress the resistance and repetitions only as guided.  You may experience muscle soreness or fatigue, but the pain or discomfort you are trying to   eliminate should never worsen during these exercises. If this pain does worsen, stop and make certain you are following the directions exactly. If the pain is still present after adjustments, discontinue the exercise until you can discuss the trouble with your clinician. STRENGTH - Quadriceps, Isometrics  Lie on your back with your right / left leg extended and your opposite knee  bent.  Gradually tense the muscles in the front of your right / left thigh. You should see either your knee cap slide up toward your hip or increased dimpling just above the knee. This motion will push the back of the knee down toward the floor/mat/bed on which you are lying.  Hold the muscle as tight as you can without increasing your pain for __________ seconds.  Relax the muscles slowly and completely in between each repetition. Repeat __________ times. Complete this exercise __________ times per day.  STRENGTH - Quadriceps, Short Arcs   Lie on your back. Place a __________ inch towel roll under your knee so that the knee slightly bends.  Raise only your lower leg by tightening the muscles in the front of your thigh. Do not allow your thigh to rise.  Hold this position for __________ seconds. Repeat __________ times. Complete this exercise __________ times per day.  OPTIONAL ANKLE WEIGHTS: Begin with ____________________, but DO NOT exceed ____________________. Increase in 1 pound/0.5 kilogram increments.  STRENGTH - Quadriceps, Straight Leg Raises  Quality counts! Watch for signs that the quadriceps muscle is working to insure you are strengthening the correct muscles and not "cheating" by substituting with healthier muscles.  Lay on your back with your right / left leg extended and your opposite knee bent.  Tense the muscles in the front of your right / left thigh. You should see either your knee cap slide up or increased dimpling just above the knee. Your thigh may even quiver.  Tighten these muscles even more and raise your leg 4 to 6 inches off the floor. Hold for __________ seconds.  Keeping these muscles tense, lower your leg.  Relax the muscles slowly and completely in between each repetition. Repeat __________ times. Complete this exercise __________ times per day.  STRENGTH - Hamstring, Curls  Lay on your stomach with your legs extended. (If you lay on a bed, your feet  may hang over the edge.)  Tighten the muscles in the back of your thigh to bend your right / left knee up to 90 degrees. Keep your hips flat on the bed/floor.  Hold this position for __________ seconds.  Slowly lower your leg back to the starting position. Repeat __________ times. Complete this exercise __________ times per day.  OPTIONAL ANKLE WEIGHTS: Begin with ____________________, but DO NOT exceed ____________________. Increase in 1 pound/0.5 kilogram increments.  STRENGTH - Quadriceps, Squats  Stand in a door frame so that your feet and knees are in line with the frame.  Use your hands for balance, not support, on the frame.  Slowly lower your weight, bending at the hips and knees. Keep your lower legs upright so that they are parallel with the door frame. Squat only within the range that does not increase your knee pain. Never let your hips drop below your knees.  Slowly return upright, pushing with your legs, not pulling with your hands. Repeat __________ times. Complete this exercise __________ times per day.  STRENGTH - Quadriceps, Wall Slides  Follow guidelines for form closely. Increased knee pain often results from poorly placed feet or knees.    Lean against a smooth wall or door and walk your feet out 18-24 inches. Place your feet hip-width apart.  Slowly slide down the wall or door until your knees bend __________ degrees.* Keep your knees over your heels, not your toes, and in line with your hips, not falling to either side.  Hold for __________ seconds. Stand up to rest for __________ seconds in between each repetition. Repeat __________ times. Complete this exercise __________ times per day. * Your physician, physical therapist, or athletic trainer will alter this angle based on your symptoms and progress.   This information is not intended to replace advice given to you by your health care provider. Make sure you discuss any questions you have with your health care  provider.   Document Released: 09/13/2005 Document Revised: 11/20/2014 Document Reviewed: 02/11/2009 Elsevier Interactive Patient Education 2016 [employer]  

## 2015-09-07 NOTE — Progress Notes (Signed)
Patient ID: Rodney Nunez, male   DOB: [birth year], 29 y.o.   MRN: [name]   09/07/2015 at 11:30 AM  Rodney Fendereginald D Nunez / DOB: [birth year] / MRN: [id number]  Problem list reviewed and updated by me where necessary.   SUBJECTIVE  Rodney FenderReginald D Swartzentruber is a 29 y.o. well appearing male presenting for the chief complaint of pain right knee from fall. See last visit. He is better, still painful to put pressure on knee or work on knee. No weakness, numbness, or function loss..     He  has a past medical history of Hypertension and Asthma.    Medications reviewed and updated by myself where necessary, and exist elsewhere in the encounter.   Mr. Rodney Nunez is allergic to penicillins. He  reports that he has never smoked. He has never used smokeless tobacco. He reports that he drinks alcohol. He reports that he does not use illicit drugs. He  has no sexual activity history on file. The patient  has past surgical history that includes Mouth surgery.  His family history includes Diabetes in his mother; Heart attack in his father; Heart disease in his father; Hypertension in his mother and sister.  ROS  OBJECTIVE  His  height is 6' (1.829 m) and weight is 229 lb (103.874 kg). His oral temperature is 97.7 F (36.5 C). His blood pressure is 108/78 and his pulse is 60. His respiration is 18 and oxygen saturation is 98%.  The patient's body mass index is 31.05 kg/(m^2).  Physical Exam  Constitutional: He is oriented to person, place, and time. He appears well-developed and well-nourished. No distress.  HENT:  Head: Normocephalic.  Nose: Nose normal.  Eyes: Conjunctivae and EOM are normal.  Respiratory: Effort normal.  Musculoskeletal:       Right knee: He exhibits normal range of motion, no swelling, no effusion, no ecchymosis, no deformity, no laceration and no erythema. Tenderness found.       Legs: Tender to touch or put pressure. xrays are normal  Neurological: He is alert and oriented to  person, place, and time. No cranial nerve deficit. He exhibits normal muscle tone. Coordination normal.  Skin: No rash noted.  Psychiatric: He has a normal mood and affect.    No results found for this or any previous visit (from the past 24 hour(s)).  ASSESSMENT & PLAN  Rodney Nunez was seen today for follow-up.  Diagnoses and all orders for this visit:  Knee pain, right  Contusion, knee, right, subsequent encounter  Wear cushioned knee sleeve Protect knee Ok to work full duty

## 2015-10-11 ENCOUNTER — Ambulatory Visit (INDEPENDENT_AMBULATORY_CARE_PROVIDER_SITE_OTHER): Payer: Worker's Compensation | Admitting: Internal Medicine

## 2015-10-11 VITALS — BP 122/76 | HR 61 | Temp 97.8°F | Resp 16 | Ht 71.0 in | Wt 225.6 lb

## 2015-10-11 DIAGNOSIS — S8001XS Contusion of right knee, sequela: Secondary | ICD-10-CM | POA: Diagnosis not present

## 2015-10-11 DIAGNOSIS — S8011XS Contusion of right lower leg, sequela: Secondary | ICD-10-CM

## 2015-10-11 NOTE — Progress Notes (Signed)
Patient ID: Rodney Nunez, male   DOB: 11/02/1986, 29 y.o.   MRN: [id number]   10/11/2015 at 8:50 AM  Rodney Nunez / DOB: 11/02/1986 / MRN: [id number]  Problem list reviewed and updated by me where necessary.   SUBJECTIVE  Rodney Nunez is a 29 y.o. well appearing male presenting for the chief complaint of contusion knee follow up visit for work..  He states all sxs resolved and he feels 100% well.   He  has a past medical history of Hypertension and Asthma.    Medications reviewed and updated by myself where necessary, and exist elsewhere in the encounter.   Rodney Nunez is allergic to penicillins. He  reports that he has never smoked. He has never used smokeless tobacco. He reports that he drinks alcohol. He reports that he does not use illicit drugs. He  has no sexual activity history on file. The patient  has past surgical history that includes Mouth surgery.  His family history includes Diabetes in his mother; Heart attack in his father; Heart disease in his father; Hypertension in his mother and sister.  Review of Systems  Constitutional: Negative for fever.  Respiratory: Negative for shortness of breath.   Cardiovascular: Negative for chest pain.  Gastrointestinal: Negative for nausea.  Skin: Negative for rash.  Neurological: Negative for dizziness and headaches.    OBJECTIVE  His  height is 5\' 11"  (1.803 m) and weight is 225 lb 9.6 oz (102.331 kg). His oral temperature is 97.8 F (36.6 C). His blood pressure is 122/76 and his pulse is 61. His respiration is 16.  The patient's body mass index is 31.48 kg/(m^2).  Physical Exam  Constitutional: He is oriented to person, place, and time. He appears well-developed and well-nourished. No distress.  HENT:  Head: Normocephalic.  Nose: Nose normal.  Eyes: Conjunctivae and EOM are normal.  Respiratory: Effort normal.  Musculoskeletal: Normal range of motion.       Right knee: Normal.  Neurological: He is alert  and oriented to person, place, and time. He exhibits normal muscle tone. Coordination normal.  Psychiatric: He has a normal mood and affect.    No results found for this or any previous visit (from the past 24 hour(s)).  ASSESSMENT & PLAN  There are no diagnoses linked to this encounter.

## 2015-10-11 NOTE — Patient Instructions (Signed)
Patellar Tendinitis With Rehab  Tendinitis is inflammation of a tendon. Tendonitis of the tendon below the kneecap (patella) is known as patellar tendonitis. Patellar tendonitis is also called jumper's knee. Jumper's knee is a common cause of pain below the kneecap (infrapatellar). Jumper's knee may involve a tear (strain) in the ligament. Strains are classified into three categories. Grade 1 strains cause pain, but the tendon is not lengthened. Grade 2 strains include a lengthened ligament, due to the ligament being stretched or partially ruptured. With grade 2 strains there is still function, although function may be decreased. Grade 3 strains involve a complete tear of the tendon or muscle, and function is usually impaired. Patellar tendon strains are usually grade 1 or 2.   SYMPTOMS   · Pain, tenderness, swelling, warmth, or redness over the patellar tendon (just below the kneecap).  · Pain and loss of strength (sometimes), with forcefully straightening the knee (especially when jumping or rising from a seated or squatting position), or bending the knee completely (squatting or kneeling).  · Crackling sound (crepitation) when the tendon is moved or touched.  CAUSES   Patellar tendonitis is caused by injury to the patellar tendon. The inflammation is the body's healing response. Common causes of injury include:  · Stress from a sudden increase in intensity, frequency, or duration of training.  · Overuse of the thigh muscles (quadriceps) and patellar tendon.  · Direct hit (trauma) to the knee or patellar tendon.  RISK INCREASES WITH:  · Sports that require sudden, explosive quadriceps contraction, such as jumping, quick starts, or kicking.  · Running sports, especially running down hills.  · Poor strength and flexibility of the thigh and knee.  · Flat feet.  PREVENTION  · Warm up and stretch properly before activity.  · Allow for adequate recovery between workouts.  · Maintain physical fitness:    Strength,  flexibility, and endurance.    Cardiovascular fitness.  · Protect the knee joint with taping, protective strapping, bracing, or elastic compression bandage.  · Wear arch supports (orthotics).  PROGNOSIS   If treated properly, patellar tendonitis usually heals within 6 weeks.   RELATED COMPLICATIONS   · Longer healing time if not properly treated or if not given enough time to heal.  · Recurring symptoms if activity is resumed too soon, with overuse, with a direct blow, or when using poor technique.  · If untreated, tendon rupture requiring surgery.  TREATMENT  Treatment first involves the use of ice and medicine to reduce pain and inflammation. The use of strengthening and stretching exercises may help reduce pain with activity. These exercises may be performed at home or with a therapist. Serious cases of tendonitis may require restraining the knee for 10 to 14 days to prevent stress on the tendon and to promote healing. Crutches may be used (uncommon) until you can walk without a limp. For cases in which nonsurgical treatment is unsuccessful, surgery may be advised to remove the inflamed tendon lining (sheath). Surgery is rare, and is only advised after at least 6 months of nonsurgical treatment.  MEDICATION   · If pain medicine is needed, nonsteroidal anti-inflammatory medicines (aspirin and ibuprofen), or other minor pain relievers (acetaminophen), are often advised.  · Do not take pain medicine for 7 days before surgery.  · Prescription pain relievers may be given if your caregiver thinks they are needed. Use only as directed and only as much as you need.  HEAT AND COLD  · Cold treatment (icing)   should be applied for 10 to 15 minutes every 2 to 3 hours for inflammation and pain, and immediately after activity that aggravates your symptoms. Use ice packs or an ice massage.  · Heat treatment may be used before performing stretching and strengthening activities prescribed by your caregiver, physical therapist, or  athletic trainer. Use a heat pack or a warm water soak.  SEEK MEDICAL CARE IF:  · Symptoms get worse or do not improve in 2 weeks, despite treatment.  · New, unexplained symptoms develop. (Drugs used in treatment may produce side effects.)  EXERCISES  RANGE OF MOTION (ROM) AND STRETCHING EXERCISES - Patellar Tendinitis (Jumper's Knee)  These are some of the initial exercises with which you may start your rehabilitation program, until you see your caregiver again or until your symptoms are resolved. Remember:   · Flexible tissue is more tolerant of the stresses placed on it during activities.  · Each stretch should be held for 20 to 30 seconds.  · A gentle stretching sensation should be felt.  STRETCH - Hamstrings, Supine  · Lie on your back. Loop a belt or towel over the ball of your right / left foot.  · Straighten your right / left knee and slowly pull on the belt to raise your leg. Do not allow the right / left knee to bend. Keep your opposite leg flat on the floor.  · Raise the leg until you feel a gentle stretch behind your right / left knee or thigh. Hold this position for __________ seconds.  Repeat __________ times. Complete this stretch __________ times per day.   STRETCH - Hamstrings, Doorway  · Lie on your back with your right / left leg extended and resting on the wall, and the opposite leg flat on the ground through the door. At first, position your bottom farther away from the wall.  · Keep your right / left knee straight. If you feel a stretch behind your knee or thigh, hold this position for __________ seconds.  · If you do not feel a stretch, scoot your bottom closer to the door, and hold __________ seconds.  Repeat __________ times. Complete this stretch __________ times per day.   STRETCH - Hamstrings, Standing  · Stand or sit and extend your right / left leg, placing your foot on a chair or foot stool.  · Keep a slight arch in your low back and your hips straight forward.  · Lead with your chest  and lean forward at the waist until you feel a gentle stretch in the back of your right / left knee or thigh. (When done correctly, this exercise requires leaning only a small distance.)  · Hold this position for __________ seconds.  Repeat __________ times. Complete this stretch __________ times per day.  STRETCH - Adductors, Lunge  · While standing, spread your legs, with your right / left leg behind you.  · Lean away from your right / left leg by bending your opposite knee. You may rest your hands on your thigh for balance.  · You should feel a stretch in your right / left inner thigh. Hold for __________ seconds.  Repeat __________ times. Complete this exercise __________ times per day.   STRENGTHENING EXERCISES - Patellar Tendinitis (Jumper's Knee)  These exercises may help you when beginning to rehabilitate your injury. They may resolve your symptoms with or without further involvement from your physician, physical therapist or athletic trainer. While completing these exercises, remember:   · Muscles   can gain both the endurance and the strength needed for everyday activities through controlled exercises.  · Complete these exercises as instructed by your physician, physical therapist or athletic trainer. Increase the resistance and repetitions only as guided by your caregiver.  STRENGTH - Quadriceps, Isometrics  · Lie on your back with your right / left leg extended and your opposite knee bent.  · Gradually tense the muscles in the front of your right / left thigh. You should see either your kneecap slide up toward your hip or increased dimpling just above the knee. This motion will push the back of the knee down toward the floor, mat, or bed on which you are lying.  · Hold the muscle as tight as you can, without increasing your pain, for __________ seconds.  · Relax the muscles slowly and completely in between each repetition.  Repeat __________ times. Complete this exercise __________ times per day.      STRENGTH - Quadriceps, Short Arcs  · Lie on your back. Place a __________ inch towel roll under your right / left knee, so that the knee bends slightly.  · Raise only your lower leg by tightening the muscles in the front of your thigh. Do not allow your thigh to rise.  · Hold this position for __________ seconds.  Repeat __________ times. Complete this exercise __________ times per day.   OPTIONAL ANKLE WEIGHTS: Begin with ____________________, but DO NOT exceed ____________________. Increase in 1 pound/ 0.5 kilogram increments.  STRENGTH - Quadriceps, Straight Leg Raises   Quality counts! Watch for signs that the quadriceps muscle is working, to be sure you are strengthening the correct muscles and not "cheating" by substituting with healthier muscles.  · Lay on your back with your right / left leg extended and your opposite knee bent.  · Tense the muscles in the front of your right / left thigh. You should see either your kneecap slide up or increased dimpling just above the knee. Your thigh may even shake a bit.  · Tighten these muscles even more and raise your leg 4 to 6 inches off the floor. Hold for __________ seconds.  · Keeping these muscles tense, lower your leg.  · Relax the muscles slowly and completely between each repetition.  Repeat __________ times. Complete this exercise __________ times per day.   STRENGTH - Quadriceps, Squats  · Stand in a door frame so that your feet and knees are in line with the frame.  · Use your hands for balance, not support, on the frame.  · Slowly lower your weight, bending at the hips and knees. Keep your lower legs upright so that they are parallel with the door frame. Squat only within the range that does not increase your knee pain. Never let your hips drop below your knees.  · Slowly return upright, pushing with your legs, not pulling with your hands.  Repeat __________ times. Complete this exercise __________ times per day.   STRENGTH - Quadriceps,  Step-Downs  · Stand on the edge of a step stool or stair. Be prepared to use a countertop or wall for balance, if needed.  · Keeping your right / left knee directly over the middle of your foot, slowly touch your opposite heel to the floor or lower step. Do not go all the way to the floor if your knee pain increases; just go as far as you can without increased discomfort. Use your right / left leg muscles, not gravity to lower your   body weight.  · Slowly push your body weight back up to the starting position.  Repeat __________ times. Complete this exercise __________ times per day.      This information is not intended to replace advice given to you by your health care provider. Make sure you discuss any questions you have with your health care provider.     Document Released: 10/30/2005 Document Revised: 03/16/2015 Document Reviewed: 02/11/2009  [employer] Interactive Patient Education ©2016 [employer]

## 2016-06-14 ENCOUNTER — Emergency Department (HOSPITAL_COMMUNITY)
Admission: EM | Admit: 2016-06-14 | Discharge: 2016-06-14 | Disposition: A | Discharge status: Home / Self Care | Payer: Managed Care, Other (non HMO) | Attending: Emergency Medicine | Admitting: Emergency Medicine

## 2016-06-14 ENCOUNTER — Emergency Department (HOSPITAL_COMMUNITY): Payer: Managed Care, Other (non HMO)

## 2016-06-14 ENCOUNTER — Encounter (HOSPITAL_COMMUNITY): Payer: Self-pay | Admitting: Neurology

## 2016-06-14 DIAGNOSIS — I10 Essential (primary) hypertension: Secondary | ICD-10-CM | POA: Insufficient documentation

## 2016-06-14 DIAGNOSIS — J45909 Unspecified asthma, uncomplicated: Secondary | ICD-10-CM | POA: Insufficient documentation

## 2016-06-14 DIAGNOSIS — R079 Chest pain, unspecified: Secondary | ICD-10-CM

## 2016-06-14 DIAGNOSIS — R071 Chest pain on breathing: Secondary | ICD-10-CM | POA: Diagnosis present

## 2016-06-14 LAB — CBC
HEMATOCRIT: 40.1 % (ref 39.0–52.0)
Hemoglobin: 13.1 g/dL (ref 13.0–17.0)
MCH: 28.5 pg (ref 26.0–34.0)
MCHC: 32.7 g/dL (ref 30.0–36.0)
MCV: 87.4 fL (ref 78.0–100.0)
PLATELETS: 243 10*3/uL (ref 150–400)
RBC: 4.59 MIL/uL (ref 4.22–5.81)
RDW: 13.7 % (ref 11.5–15.5)
WBC: 5.2 10*3/uL (ref 4.0–10.5)

## 2016-06-14 LAB — BASIC METABOLIC PANEL
Anion gap: 5 (ref 5–15)
BUN: 12 mg/dL (ref 6–20)
CO2: 26 mmol/L (ref 22–32)
CREATININE: 1.07 mg/dL (ref 0.61–1.24)
Calcium: 9.3 mg/dL (ref 8.9–10.3)
Chloride: 110 mmol/L (ref 101–111)
GFR calc Af Amer: >60 mL/min (ref >60)
GLUCOSE: 99 mg/dL (ref 65–99)
POTASSIUM: 4.2 mmol/L (ref 3.5–5.1)
Sodium: 141 mmol/L (ref 135–145)

## 2016-06-14 LAB — I-STAT TROPONIN, ED: Troponin i, poc: 0 ng/mL (ref 0.00–0.08)

## 2016-06-14 MED ORDER — DEXAMETHASONE 4 MG PO TABS
10.0000 mg | ORAL_TABLET | Freq: Once | ORAL | Status: AC
  Administered 2016-06-14: 10 mg via ORAL
  Filled 2016-06-14: qty 3

## 2016-06-14 MED ORDER — ALBUTEROL SULFATE HFA 108 (90 BASE) MCG/ACT IN AERS
4.0000 | INHALATION_SPRAY | Freq: Once | RESPIRATORY_TRACT | Status: AC
  Administered 2016-06-14: 4 via RESPIRATORY_TRACT
  Filled 2016-06-14: qty 6.7

## 2016-06-14 NOTE — ED Triage Notes (Signed)
Pt reports cp x several days, thinks it may be r/t his asthma and working in a cold environment. Pt reports central 4/10 cp worse with cough. Denies n/v. Skin warm and dry. No fever. Has yellow sputum. Lung sounds clear.

## 2016-06-14 NOTE — ED Provider Notes (Signed)
[facility] DEPT Provider Note   CSN: [id number] Arrival date & time: 06/14/16  1421  First Provider Contact:  First MD Initiated Contact with Patient 06/14/16 1829        History   Chief Complaint Chief Complaint  Patient presents with  . Chest Pain    HPI Rodney Nunez is a 30 y.o. male.  30 yo M with a chief complaint of right-sided chest pain. Is worse when he takes a deep breath or moves his chest wall. He has been having a cough and congestion over the past couple days as well. 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 His cough is slowly worsened with this as well. Feels he is having trouble breathing and has a history of asthma but has no inhaler. Denies fevers or chills. Denies exertional symptoms. Denies PE risk factors.   The history is provided by the patient.  Chest Pain   This is a new problem. The current episode started less than 1 hour ago. The problem occurs constantly. The problem has not changed since onset.The pain is associated with breathing. The pain is present in the lateral region. The pain is at a severity of 6/10. The pain is moderate. The quality of the pain is described as pleuritic. The pain does not radiate. Duration of episode(s) is 2 days. The symptoms are aggravated by deep breathing. Associated symptoms include cough and shortness of breath. Pertinent negatives include no abdominal pain, no fever, no headaches, no palpitations and no vomiting. He has tried nothing for the symptoms. The treatment provided no relief. There are no known risk factors.    Past Medical History:  Diagnosis Date  . Asthma   . Hypertension     Patient Active Problem List   Diagnosis Date Noted  . Childhood asthma 02/24/2014  . RHINITIS, ALLERGIC 01/10/2007  . ACNE 01/10/2007  . BICIPITAL TENDONITIS 01/10/2007    Past Surgical History:  Procedure Laterality Date  . MOUTH SURGERY         Home Medications    Prior to  Admission medications   Medication Sig Start Date End Date Taking? Authorizing Provider  naproxen sodium (ANAPROX DS) 550 MG tablet Take 1 tablet (550 mg total) by mouth 2 (two) times daily with a meal. Patient not taking: Reported on 06/14/2016 08/31/15   Dorna Leitz, PA-C    Family History Family History  Problem Relation Age of Onset  . Heart attack Father   . Heart disease Father   . Hypertension Sister   . Hypertension Mother   . Diabetes Mother     Social History Social History  Substance Use Topics  . Smoking status: Never Smoker  . Smokeless tobacco: Never Used  . Alcohol use Yes     Comment: occassional     Allergies   Penicillins   Review of Systems Review of Systems  Constitutional: Negative for chills and fever.  HENT: Negative for congestion and facial swelling.   Eyes: Negative for discharge and visual disturbance.  Respiratory: Positive for cough and shortness of breath.   Cardiovascular: Negative for chest pain and palpitations.  Gastrointestinal: Negative for abdominal pain, diarrhea and vomiting.  Musculoskeletal: Negative for arthralgias and myalgias.  Skin: Negative for color change and rash.  Neurological: Negative for tremors, syncope and headaches.  Psychiatric/Behavioral: Negative for confusion and dysphoric mood.     Physical Exam Updated Vital Signs BP 125/77 (BP Location: Right Arm)  Pulse 65   Temp 98.5 F (36.9 C) (Oral)   Resp 15   SpO2 98%   Physical Exam  Constitutional: He is oriented to person, place, and time. He appears well-developed and well-nourished.  HENT:  Head: Normocephalic and atraumatic.  Eyes: Conjunctivae and EOM are normal. Pupils are equal, round, and reactive to light.  Neck: Normal range of motion. No JVD present.  Cardiovascular: Normal rate and regular rhythm.   Pulmonary/Chest: Effort normal. No stridor. No respiratory distress. He has no wheezes. He exhibits no tenderness.  Abdominal: He exhibits no  distension. There is no tenderness. There is no guarding.  Musculoskeletal: Normal range of motion. He exhibits no edema.  Neurological: He is alert and oriented to person, place, and time.  Skin: Skin is warm and dry.  Psychiatric: He has a normal mood and affect. His behavior is normal.     ED Treatments / Results  Labs (all labs ordered are listed, but only abnormal results are displayed) Labs Reviewed  BASIC METABOLIC PANEL  CBC  I-STAT TROPOININ, ED    EKG  EKG Interpretation  Date/Time:  Wednesday June 14 2016 14:26:47 EDT Ventricular Rate:  69 PR Interval:  144 QRS Duration: 88 QT Interval:  386 QTC Calculation: 413 R Axis:   80 Text Interpretation:  Normal sinus rhythm Normal ECG No significant change since last tracing Confirmed by Lakela Kuba MD, DANIEL [phone]) on 06/14/2016 5:55:02 PM       Radiology Dg Chest 2 View  Result Date: 06/14/2016 CLINICAL DATA:  Chest pain, shortness of breath. EXAM: CHEST  2 VIEW COMPARISON:  Radiographs of December 18, 2010. FINDINGS: The heart size and mediastinal contours are within normal limits. Both lungs are clear. No pneumothorax or pleural effusion is noted. The visualized skeletal structures are unremarkable. IMPRESSION: No active cardiopulmonary disease. Electronically Signed   By: Lupita Raider, M.D.   On: 06/14/2016 15:18    Procedures Procedures (including critical care time)  Medications Ordered in ED Medications  albuterol (PROVENTIL HFA;VENTOLIN HFA) 108 (90 Base) MCG/ACT inhaler 4 puff (4 puffs Inhalation Given 06/14/16 1905)  dexamethasone (DECADRON) tablet 10 mg (10 mg Oral Given 06/14/16 1905)     Initial Impression / Assessment and Plan / ED Course  I have reviewed the triage vital signs and the nursing notes.  Pertinent labs & imaging results that were available during my care of the patient were reviewed by me and considered in my medical decision making (see chart for details).  Clinical Course    30 yo M  With a chief complaint of cough and pleuritic chest pain. Sounds secondary to lung irritation with cold exposure. Will do trial of bronchodilators. Give a dose of Decadron. PCP follow-up.  11:16 PM:  I have discussed the diagnosis/risks/treatment options with the patient and believe the pt to be eligible for discharge home to follow-up with PCP. We also discussed returning to the ED immediately if new or worsening sx occur. We discussed the sx which are most concerning (e.g., sudden worsening pain, fever, inability to tolerate by mouth) that necessitate immediate return. Medications administered to the patient during their visit and any new prescriptions provided to the patient are listed below.  Medications given during this visit Medications  albuterol (PROVENTIL HFA;VENTOLIN HFA) 108 (90 Base) MCG/ACT inhaler 4 puff (4 puffs Inhalation Given 06/14/16 1905)  dexamethasone (DECADRON) tablet 10 mg (10 mg Oral Given 06/14/16 1905)     The patient appears reasonably screen and/or stabilized for  discharge and I doubt any other medical condition or other [facility] requiring further screening, evaluation, or treatment in the ED at this time prior to discharge.    Final Clinical Impressions(s) / ED Diagnoses   Final diagnoses:  Nonspecific chest pain    New Prescriptions Discharge Medication List as of 06/14/2016  6:57 PM       Melene Plan, DO 06/14/16 2316

## 2016-06-14 NOTE — ED Notes (Signed)
Pt states  He understands instructions. Home stable with steady gait.

## 2016-10-09 ENCOUNTER — Encounter (HOSPITAL_COMMUNITY): Payer: Self-pay | Admitting: Emergency Medicine

## 2016-10-09 ENCOUNTER — Emergency Department (HOSPITAL_COMMUNITY)
Admission: EM | Admit: 2016-10-09 | Discharge: 2016-10-09 | Disposition: A | Discharge status: Home / Self Care | Payer: Self-pay | Attending: Emergency Medicine | Admitting: Emergency Medicine

## 2016-10-09 DIAGNOSIS — J45909 Unspecified asthma, uncomplicated: Secondary | ICD-10-CM | POA: Insufficient documentation

## 2016-10-09 DIAGNOSIS — I10 Essential (primary) hypertension: Secondary | ICD-10-CM | POA: Insufficient documentation

## 2016-10-09 DIAGNOSIS — H9202 Otalgia, left ear: Secondary | ICD-10-CM | POA: Insufficient documentation

## 2016-10-09 DIAGNOSIS — Z79899 Other long term (current) drug therapy: Secondary | ICD-10-CM | POA: Insufficient documentation

## 2016-10-09 MED ORDER — PSEUDOEPHEDRINE HCL ER 120 MG PO TB12: 120.0000 mg | ORAL_TABLET | Freq: Two times a day (BID) | ORAL | 0 refills | Status: DC

## 2016-10-09 MED ORDER — IBUPROFEN 400 MG PO TABS
600.0000 mg | ORAL_TABLET | Freq: Once | ORAL | Status: AC
  Administered 2016-10-09: 600 mg via ORAL
  Filled 2016-10-09: qty 1

## 2016-10-09 NOTE — Discharge Instructions (Signed)
Read the information below.  You have fluid behind the ear. Try taking tylenol 650mg  every 6hrs or motrin 400mg  every 6hrs for pain relief. I have prescribed sudafed to help with getting rid of the fluid.  If symptoms worsen or do not improve in the next 2-3 days with treatment please return to ED or to your primary provider for re-evaluation.  Use the prescribed medication as directed.  Please discuss all new medications with your pharmacist.   You may return to the Emergency Department at any time for worsening condition or any new symptoms that concern you.

## 2016-10-09 NOTE — ED Provider Notes (Signed)
[facility] Provider Note   CSN: [id number] Arrival date & time: 10/09/16  [id number]  By signing my name below, I, Rodney Nunez, attest that this documentation has been prepared under the direction and in the presence of [name], PA-C.  Electronically Signed: Rosario AdieWilliam Andrew Nunez, ED Scribe. 10/09/16. 8:09 PM.  History   Chief Complaint Chief Complaint  Patient presents with  . Otalgia   The history is provided by the patient. No language interpreter was used.   HPI Comments: Rodney Nunez is a 30 y.o. male with a h/o asthma and HTN, who presents to the Emergency Department complaining of gradual onset, intermittent left-sided ear pain onset approximately 3 day ago. Pt describes his pain as sharp and throbbing. He notes associated frontal sinus pressure and ear congestion secondary to his otalgia. Pt additionally states that he has rhinorrhea and nasal congestion at baseline due to working in a cold work environment. No treatments for his pain were tried prior to coming into the ED. No recent trama to the ear, however, he does wear ear buds to listen to music frequently. No recent underwater head submersion, swimming, or q-tip usage. No recent illness/infections. His girlfriend's son was recently sick with similar symptoms. He denies fever, rash, sore throat, ear drainage, hearing loss or any other associated symptoms.   Past Medical History:  Diagnosis Date  . Asthma   . Hypertension    Patient Active Problem List   Diagnosis Date Noted  . Childhood asthma 02/24/2014  . RHINITIS, ALLERGIC 01/10/2007  . ACNE 01/10/2007  . BICIPITAL TENDONITIS 01/10/2007   Past Surgical History:  Procedure Laterality Date  . MOUTH SURGERY      Home Medications    Prior to Admission medications   Medication Sig Start Date End Date Taking? Authorizing Provider  naproxen sodium (ANAPROX DS) 550 MG tablet Take 1 tablet (550 mg total) by mouth 2 (two) times daily with a  meal. Patient not taking: Reported on 06/14/2016 08/31/15   Dorna LeitzNicole V Bush, PA-C  pseudoephedrine (SUDAFED 12 HOUR) 120 MG 12 hr tablet Take 1 tablet (120 mg total) by mouth 2 (two) times daily. 10/09/16   Lona KettleAshley Laurel Veronica Guerrant, PA-C   Family History Family History  Problem Relation Age of Onset  . Heart attack Father   . Heart disease Father   . Hypertension Sister   . Hypertension Mother   . Diabetes Mother    Social History Social History  Substance Use Topics  . Smoking status: Never Smoker  . Smokeless tobacco: Never Used  . Alcohol use Yes     Comment: occassional   Allergies   Penicillins  Review of Systems Review of Systems  Constitutional: Negative for fever.  HENT: Positive for congestion, ear pain, rhinorrhea and sinus pressure. Negative for ear discharge, hearing loss and sore throat.   Skin: Negative for rash.   Physical Exam Updated Vital Signs BP 136/90 (BP Location: Right Arm)   Pulse 64   Temp 97.7 F (36.5 C) (Oral)   Resp 22   Ht 6' (1.829 m)   Wt 104.3 kg   SpO2 99%   BMI 31.19 kg/m   Physical Exam  Constitutional: He appears well-developed and well-nourished. No distress.  HENT:  Head: Normocephalic and atraumatic.  Right Ear: Tympanic membrane, external ear and ear canal normal. Tympanic membrane is not injected, not erythematous, not retracted and not bulging.  Left Ear: External ear and ear canal normal. Tympanic membrane is bulging. Tympanic membrane  is not injected and not erythematous. A middle ear effusion is present.  Nose: Nose normal.  Mouth/Throat: Uvula is midline, oropharynx is clear and moist and mucous membranes are normal. No trismus in the jaw. No uvula swelling. No oropharyngeal exudate, posterior oropharyngeal edema, posterior oropharyngeal erythema or tonsillar abscesses. No tonsillar exudate.  No mastoid, pinna, or tragus tenderness. No warmth or erythema to external ear.  Bulging of left TM with serous fluid without injection or  erythema.   Eyes: Conjunctivae and EOM are normal. Pupils are equal, round, and reactive to light. Right eye exhibits no discharge. Left eye exhibits no discharge. No scleral icterus.  Neck: Normal range of motion. Neck supple.  No nuchal rigidity.   Pulmonary/Chest: Effort normal. No respiratory distress.  Lymphadenopathy:    He has no cervical adenopathy.  Neurological: He is alert.  Skin: Skin is warm and dry. He is not diaphoretic.  Psychiatric: He has a normal mood and affect. His behavior is normal.   ED Treatments / Results  DIAGNOSTIC STUDIES: Oxygen Saturation is 100% on RA, normal by my interpretation.   COORDINATION OF CARE: 8:09 PM-Discussed next steps with pt. Pt verbalized understanding and is agreeable with the plan.   Procedures Procedures   Medications Ordered in ED Medications  ibuprofen (ADVIL,MOTRIN) tablet 600 mg (600 mg Oral Given 10/09/16 2029)    Initial Impression / Assessment and Plan / ED Course  I have reviewed the triage vital signs and the nursing notes.  Pertinent labs & imaging results that were available during my care of the patient were reviewed by me and considered in my medical decision making (see chart for details).  Clinical Course    Patient presents to ED with complaint of left otalgia. Patient is afebrile and non-toxic appearing in NAD. VSS. Left otitis media with serous effusion without evidence of injection or erythema. No warmth or erythema of external ear noted. No TTP of mastoid, pinna, or tragus. No nuchal rigidity. At this time, ABX not indicated. Discussed symptomatic management to include ibuprofen/tylenol and sudafed. Encouraged to follow up with PCP if sxs persist. Return precautions provided. Pt voiced understanding and is agreeable.   Final Clinical Impressions(s) / ED Diagnoses   Final diagnoses:  Left ear pain   New Prescriptions Discharge Medication List as of 10/09/2016  8:27 PM    START taking these medications     Details  pseudoephedrine (SUDAFED 12 HOUR) 120 MG 12 hr tablet Take 1 tablet (120 mg total) by mouth 2 (two) times daily., Starting Mon 10/09/2016, Print       I personally performed the services described in this documentation, which was scribed in my presence. The recorded information has been reviewed and is accurate.     Lona Kettleshley Laurel Carlo Lorson, [place] JerseyPA-C 10/09/16 2229    Raeford RazorStephen Kohut, MD 10/17/16 [phone]

## 2016-10-09 NOTE — ED Triage Notes (Signed)
Pt. reports left ear ache onset last Friday , denies injury , no drainage or hearing loss.

## 2017-02-03 ENCOUNTER — Encounter (HOSPITAL_COMMUNITY): Payer: Self-pay

## 2017-02-03 ENCOUNTER — Emergency Department (HOSPITAL_COMMUNITY)
Admission: EM | Admit: 2017-02-03 | Discharge: 2017-02-03 | Disposition: A | Discharge status: Home / Self Care | Payer: Self-pay | Attending: Emergency Medicine | Admitting: Emergency Medicine

## 2017-02-03 DIAGNOSIS — I10 Essential (primary) hypertension: Secondary | ICD-10-CM | POA: Insufficient documentation

## 2017-02-03 DIAGNOSIS — H1032 Unspecified acute conjunctivitis, left eye: Secondary | ICD-10-CM | POA: Insufficient documentation

## 2017-02-03 DIAGNOSIS — B309 Viral conjunctivitis, unspecified: Secondary | ICD-10-CM

## 2017-02-03 DIAGNOSIS — J45909 Unspecified asthma, uncomplicated: Secondary | ICD-10-CM | POA: Insufficient documentation

## 2017-02-03 DIAGNOSIS — Z79899 Other long term (current) drug therapy: Secondary | ICD-10-CM | POA: Insufficient documentation

## 2017-02-03 MED ORDER — FLUORESCEIN SODIUM 0.6 MG OP STRP: 1.0000 | ORAL_STRIP | Freq: Once | OPHTHALMIC | Status: DC

## 2017-02-03 MED ORDER — TOBRAMYCIN 0.3 % OP OINT
TOPICAL_OINTMENT | Freq: Three times a day (TID) | OPHTHALMIC | Status: DC
  Administered 2017-02-03: 15:00:00 via OPHTHALMIC
  Filled 2017-02-03: qty 3.5

## 2017-02-03 MED ORDER — TETRACAINE HCL 0.5 % OP SOLN: 2.0000 [drp] | Freq: Once | OPHTHALMIC | Status: DC

## 2017-02-03 NOTE — ED Provider Notes (Signed)
[facility] Provider Note   CSN: [id number] Arrival date & time: 02/03/17  1345  By signing my name below, I, Marnette Burgessyan Andrew Long, attest that this documentation has been prepared under the direction and in the presence of Burgess AmorJulie Blayton Huttner, PA-C. Electronically Signed: Marnette Burgessyan Andrew Long, Scribe. 02/03/2017. 2:49 PM.  History   Chief Complaint Chief Complaint  Patient presents with  . Eye Drainage   The history is provided by the patient and medical records. No language interpreter was used.    HPI Comments:  Rodney Nunez is an obese 31 y.o. male with a PMHx of HTN and Asthma, who presents to the Emergency Department complaining of persistent, yellow left eye discharge onset two days ago. Pt reports waking up two days ago with some mild discharge, however, the eye has gradually worsened with swelling leading him to be seen in the [facility]ED after waking up with the eye shut together this morning. Pt has an associated symptom of visual disturbance stating "it is difficult to focus and things are blurry" but clears with blinking.  No sick contact with similar symptoms stated. He did not try anything for relief of his symptoms PTA.  Pt denies fever and any other complaints at this time.    Past Medical History:  Diagnosis Date  . Asthma   . Hypertension    Patient Active Problem List   Diagnosis Date Noted  . Childhood asthma 02/24/2014  . RHINITIS, ALLERGIC 01/10/2007  . ACNE 01/10/2007  . BICIPITAL TENDONITIS 01/10/2007   Past Surgical History:  Procedure Laterality Date  . MOUTH SURGERY      Home Medications    Prior to Admission medications   Medication Sig Start Date End Date Taking? Authorizing Provider  naproxen sodium (ANAPROX DS) 550 MG tablet Take 1 tablet (550 mg total) by mouth 2 (two) times daily with a meal. Patient not taking: Reported on 06/14/2016 08/31/15   Dorna LeitzNicole Bush V, PA-C  pseudoephedrine (SUDAFED 12 HOUR) 120 MG 12 hr tablet Take 1 tablet (120 mg total) by  mouth 2 (two) times daily. 10/09/16   Lona KettleAshley Laurel Meyer, PA-C    Family History Family History  Problem Relation Age of Onset  . Heart attack Father   . Heart disease Father   . Hypertension Sister   . Hypertension Mother   . Diabetes Mother     Social History Social History  Substance Use Topics  . Smoking status: Never Smoker  . Smokeless tobacco: Never Used  . Alcohol use Yes     Comment: occassional     Allergies   Penicillins   Review of Systems Review of Systems  Constitutional: Negative for fever.  HENT: Positive for congestion (baseline).   Eyes: Positive for discharge and visual disturbance.     Physical Exam Updated Vital Signs BP 127/73 (BP Location: Left Arm)   Pulse (!) 57   Temp 98.5 F (36.9 C) (Oral)   Resp 16   Ht 6' (1.829 m)   Wt 104.3 kg   SpO2 100%   BMI 31.19 kg/m   Physical Exam  Constitutional: He is oriented to person, place, and time. He appears well-developed and well-nourished.  HENT:  Head: Normocephalic.  Eyes: Conjunctivae are normal.  Mild left conjunctival injection with some dried yellow discharge along his lower eyelashes. Mild swelling along upper eyelid margin suggesting a possible early stye without localized induration. EOMs intact. PERRL.   Bilateral Distance: 20/16 R Distance: 20/20 L Distance: 20/25  Cardiovascular: Normal rate.   Pulmonary/Chest: Effort normal.  Musculoskeletal: Normal range of motion.  Neurological: He is alert and oriented to person, place, and time.  Skin: Skin is warm and dry.  Psychiatric: He has a normal mood and affect.  Nursing note and vitals reviewed.    ED Treatments / Results  DIAGNOSTIC STUDIES:  Oxygen Saturation is 100% on RA, normal by my interpretation.    COORDINATION OF CARE:  2:47 PM Discussed treatment plan with pt at bedside including Abx ointment and pt agreed to plan. Pt is driving himself home from the ED.  Labs (all labs ordered are listed, but  only abnormal results are displayed) Labs Reviewed - No data to display  EKG  EKG Interpretation None       Radiology No results found.  Procedures Procedures (including critical care time)  Medications Ordered in ED Medications  tobramycin (TOBREX) 0.3 % ophthalmic ointment ( Left Eye Given 02/03/17 1528)     Initial Impression / Assessment and Plan / ED Course  I have reviewed the triage vital signs and the nursing notes.  Pertinent labs & imaging results that were available during my care of the patient were reviewed by me and considered in my medical decision making (see chart for details).     tobrex ophthalmic ointment given. Warm compresses, f/u with pcp for any persistent or worsened sx.   Final Clinical Impressions(s) / ED Diagnoses   Final diagnoses:  Acute viral conjunctivitis of left eye    New Prescriptions Discharge Medication List as of 02/03/2017  3:13 PM     I personally performed the services described in this documentation, which was scribed in my presence. The recorded information has been reviewed and is accurate.     Burgess Amor, PA-C 02/03/17 1651    Canary Brim Tegeler, MD 02/03/17 2020

## 2017-02-03 NOTE — ED Triage Notes (Signed)
Onset 2 days left eye swelling, drainage- yellow discharge in corner of eye, eye crusted upon awakening.  No fever, cold/cough symptoms.

## 2017-02-03 NOTE — Discharge Instructions (Signed)
Apply the antibiotic ointment to your bilateral eyes 4 times daily for the next 5 days.   Use warm compresses as needed, wash your hands frequently.

## 2017-02-13 ENCOUNTER — Emergency Department (HOSPITAL_COMMUNITY)
Admission: EM | Admit: 2017-02-13 | Discharge: 2017-02-13 | Disposition: A | Discharge status: Home / Self Care | Payer: Self-pay | Attending: Emergency Medicine | Admitting: Emergency Medicine

## 2017-02-13 ENCOUNTER — Encounter (HOSPITAL_COMMUNITY): Payer: Self-pay | Admitting: Emergency Medicine

## 2017-02-13 DIAGNOSIS — S39012A Strain of muscle, fascia and tendon of lower back, initial encounter: Secondary | ICD-10-CM | POA: Insufficient documentation

## 2017-02-13 DIAGNOSIS — Y929 Unspecified place or not applicable: Secondary | ICD-10-CM | POA: Insufficient documentation

## 2017-02-13 DIAGNOSIS — M62830 Muscle spasm of back: Secondary | ICD-10-CM | POA: Insufficient documentation

## 2017-02-13 DIAGNOSIS — I10 Essential (primary) hypertension: Secondary | ICD-10-CM | POA: Insufficient documentation

## 2017-02-13 DIAGNOSIS — X500XXA Overexertion from strenuous movement or load, initial encounter: Secondary | ICD-10-CM | POA: Insufficient documentation

## 2017-02-13 DIAGNOSIS — Y999 Unspecified external cause status: Secondary | ICD-10-CM | POA: Insufficient documentation

## 2017-02-13 DIAGNOSIS — Y9389 Activity, other specified: Secondary | ICD-10-CM | POA: Insufficient documentation

## 2017-02-13 DIAGNOSIS — J45909 Unspecified asthma, uncomplicated: Secondary | ICD-10-CM | POA: Insufficient documentation

## 2017-02-13 DIAGNOSIS — T148XXA Other injury of unspecified body region, initial encounter: Secondary | ICD-10-CM

## 2017-02-13 MED ORDER — CYCLOBENZAPRINE HCL 10 MG PO TABS: 10.0000 mg | ORAL_TABLET | Freq: Two times a day (BID) | ORAL | 0 refills | Status: DC | PRN

## 2017-02-13 MED ORDER — IBUPROFEN 600 MG PO TABS: 600.0000 mg | ORAL_TABLET | Freq: Four times a day (QID) | ORAL | 0 refills | Status: DC | PRN

## 2017-02-13 NOTE — Discharge Instructions (Signed)

## 2017-02-13 NOTE — ED Notes (Addendum)
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 Pt reports taking 2 tylenol yesterday with no relief. Pt reports he has not been able to sleep due to discomfort.

## 2017-02-13 NOTE — ED Notes (Signed)
Gave pt heat packs for back while waiting for provider.

## 2017-02-13 NOTE — ED Provider Notes (Signed)
[facility]-EMERGENCY DEPT Provider Note   CSN: [id number] Arrival date & time: 02/13/17  1056   By signing my name below, I, Teofilo Pod, attest that this documentation has been prepared under the direction and in the presence of Graciella Freer, [place]. Electronically Signed: Teofilo Pod, ED Scribe. 02/13/2017. 12:44 PM.   History   Chief Complaint Chief Complaint  Patient presents with  . Back Pain    The history is provided by the patient. No language interpreter was used.   HPI Comments:  Rodney Nunez is a 31 y.o. male who presents to the Emergency Department complaining of recurrent lower back pain x 2-3 days. Pt describes the pain as "spasms and tightness." He reports that pain is worsened with sudden movement and certain position. He took one dose of tylenol at the onset of pain, but did not take any since. He reports mild temporary improvement with Tylenol. His pain radiates from the lower back to the right lower buttocks but does not go down his leg. He works at [employer] where he consistently lifts heavy objects. He denies any traumas, falls. He denies any numbness/weakness of extremities, saddle anesthesia, bowel or bladder incontinence, fevers, night sweats, history of IV drug use, back surgery.     Past Medical History:  Diagnosis Date  . Asthma   . Hypertension     Patient Active Problem List   Diagnosis Date Noted  . Childhood asthma 02/24/2014  . RHINITIS, ALLERGIC 01/10/2007  . ACNE 01/10/2007  . BICIPITAL TENDONITIS 01/10/2007    Past Surgical History:  Procedure Laterality Date  . MOUTH SURGERY         Home Medications    Prior to Admission medications   Medication Sig Start Date End Date Taking? Authorizing Provider  cyclobenzaprine (FLEXERIL) 10 MG tablet Take 1 tablet (10 mg total) by mouth 2 (two) times daily as needed for muscle spasms. 02/13/17   Maxwell Caul, PA-C  ibuprofen (ADVIL,MOTRIN) 600 MG tablet Take 1 tablet (600 mg  total) by mouth every 6 (six) hours as needed. 02/13/17   Maxwell Caul, PA-C  naproxen sodium (ANAPROX DS) 550 MG tablet Take 1 tablet (550 mg total) by mouth 2 (two) times daily with a meal. Patient not taking: Reported on 06/14/2016 08/31/15   Dorna Leitz, PA-C  pseudoephedrine (SUDAFED 12 HOUR) 120 MG 12 hr tablet Take 1 tablet (120 mg total) by mouth 2 (two) times daily. 10/09/16   Deborha Payment, PA-C    Family History Family History  Problem Relation Age of Onset  . Heart attack Father   . Heart disease Father   . Hypertension Sister   . Hypertension Mother   . Diabetes Mother     Social History Social History  Substance Use Topics  . Smoking status: Never Smoker  . Smokeless tobacco: Never Used  . Alcohol use Yes     Comment: occassional     Allergies   Penicillins   Review of Systems Review of Systems  Constitutional: Negative for diaphoresis and fever.  Respiratory: Negative for cough and shortness of breath.   Gastrointestinal: Negative for constipation, diarrhea, nausea and vomiting.  Genitourinary: Negative for hematuria.  Musculoskeletal: Positive for back pain.  Neurological: Negative for numbness.  All other systems reviewed and are negative.    Physical Exam Updated Vital Signs BP 128/78   Pulse 63   Temp 97.7 F (36.5 C) (Oral)   Resp 14   SpO2 100%  Physical Exam  Constitutional: He appears well-developed and well-nourished.  HENT:  Head: Normocephalic and atraumatic.  Eyes: Conjunctivae and EOM are normal. Right eye exhibits no discharge. Left eye exhibits no discharge. No scleral icterus.  Neck: Full passive range of motion without pain. Neck supple. No spinous process tenderness and no muscular tenderness present.  Cardiovascular: Normal rate, regular rhythm and intact distal pulses.   Pulmonary/Chest: Effort normal.  Musculoskeletal: He exhibits no deformity.       Cervical back: He exhibits no tenderness.       Thoracic back: He  exhibits no tenderness.       Lumbar back: He exhibits no tenderness.       Back:  Diffuse right paraspinal tenderness at the lumbar region. Full flexion of back. Limited extension due to pain.   Neurological: He is alert.  5/5 strength to upper and lower extremities.  No gait abnormalities Sensation intact to all major nerve distributions.  Skin: Skin is warm and dry.  Psychiatric: He has a normal mood and affect. His speech is normal and behavior is normal.     ED Treatments / Results  DIAGNOSTIC STUDIES:  Oxygen Saturation is 100% on RA, normal by my interpretation.    COORDINATION OF CARE:    Labs (all labs ordered are listed, but only abnormal results are displayed) Labs Reviewed - No data to display  EKG  EKG Interpretation None       Radiology No results found.  Procedures Procedures (including critical care time)  Medications Ordered in ED Medications - No data to display   Initial Impression / Assessment and Plan / ED Course  I have reviewed the triage vital signs and the nursing notes.  Pertinent labs & imaging results that were available during my care of the patient were reviewed by me and considered in my medical decision making (see chart for details).    31 yo M presents with back pain x 2 days. History of lifting heavy things at work. Physical exam with paraspinal tenderness to the right lumbar region. No midline tenderness. No red flag symptoms.  No neurological deficits on exam. No bowel/bladder incontinence, no numbness/weakness of extremities, no saddle anesthesia. No fever, night sweats or weight loss. No history of cancer. No IVDA. Consider musculoskeletal strain vs mechanical back pain. Given reassuring history/physical exam and lack of trauma, no imaging indicated at this time. Symptoms likely a cause of muscular strain. Will plan to treat with NSAIDs and Flexeril for symptomatic relief. Plan to also provide patient with stretches that can  help relieve muscular tension.   Reviewed plan with patient and he is in agreement. Instructed him to follow-up with his primary care doctor in the next 2 days. Return precautions discussed. Patient expresses understanding and agreement to plan.    Final Clinical Impressions(s) / ED Diagnoses   Final diagnoses:  Muscle spasm of back  Musculoskeletal strain    New Prescriptions Discharge Medication List as of 02/13/2017 12:52 PM    START taking these medications   Details  cyclobenzaprine (FLEXERIL) 10 MG tablet Take 1 tablet (10 mg total) by mouth 2 (two) times daily as needed for muscle spasms., Starting Tue 02/13/2017, Print    ibuprofen (ADVIL,MOTRIN) 600 MG tablet Take 1 tablet (600 mg total) by mouth every 6 (six) hours as needed., Starting Tue 02/13/2017, Print      I personally performed the services described in this documentation, which was scribed in my presence. The recorded information has  been reviewed and is accurate.      Maxwell Caul, PA-C 02/13/17 1724    Maxwell Caul, PA-C 02/13/17 1725    Pricilla Loveless, MD 02/15/17 [phone]

## 2017-02-13 NOTE — ED Triage Notes (Signed)
Pt sts lower back pain x several days worse with movement

## 2017-03-02 ENCOUNTER — Emergency Department (HOSPITAL_COMMUNITY)
Admission: EM | Admit: 2017-03-02 | Discharge: 2017-03-02 | Disposition: A | Discharge status: Home / Self Care | Payer: Self-pay | Attending: Emergency Medicine | Admitting: Emergency Medicine

## 2017-03-02 DIAGNOSIS — Y929 Unspecified place or not applicable: Secondary | ICD-10-CM | POA: Insufficient documentation

## 2017-03-02 DIAGNOSIS — X500XXA Overexertion from strenuous movement or load, initial encounter: Secondary | ICD-10-CM | POA: Insufficient documentation

## 2017-03-02 DIAGNOSIS — J45909 Unspecified asthma, uncomplicated: Secondary | ICD-10-CM | POA: Insufficient documentation

## 2017-03-02 DIAGNOSIS — I10 Essential (primary) hypertension: Secondary | ICD-10-CM | POA: Insufficient documentation

## 2017-03-02 DIAGNOSIS — Y990 Civilian activity done for income or pay: Secondary | ICD-10-CM | POA: Insufficient documentation

## 2017-03-02 DIAGNOSIS — Y939 Activity, unspecified: Secondary | ICD-10-CM | POA: Insufficient documentation

## 2017-03-02 DIAGNOSIS — S39012A Strain of muscle, fascia and tendon of lower back, initial encounter: Secondary | ICD-10-CM | POA: Insufficient documentation

## 2017-03-02 DIAGNOSIS — Z79899 Other long term (current) drug therapy: Secondary | ICD-10-CM | POA: Insufficient documentation

## 2017-03-02 MED ORDER — CYCLOBENZAPRINE HCL 10 MG PO TABS: 10.0000 mg | ORAL_TABLET | Freq: Two times a day (BID) | ORAL | 0 refills | Status: DC | PRN

## 2017-03-02 MED ORDER — IBUPROFEN 600 MG PO TABS: 600.0000 mg | ORAL_TABLET | Freq: Three times a day (TID) | ORAL | 0 refills | Status: DC | PRN

## 2017-03-02 MED ORDER — LIDOCAINE 5 % EX PTCH
1.0000 | MEDICATED_PATCH | Freq: Once | CUTANEOUS | Status: DC
  Administered 2017-03-02: 1 via TRANSDERMAL
  Filled 2017-03-02: qty 1

## 2017-03-02 MED ORDER — KETOROLAC TROMETHAMINE 15 MG/ML IJ SOLN
15.0000 mg | Freq: Once | INTRAMUSCULAR | Status: AC
  Administered 2017-03-02: 15 mg via INTRAMUSCULAR
  Filled 2017-03-02: qty 1

## 2017-03-02 NOTE — ED Notes (Signed)
Bed: WA24 Expected date:  Expected time:  Means of arrival:  Comments: 

## 2017-03-02 NOTE — ED Provider Notes (Signed)
[facility]-EMERGENCY DEPT Provider Note   CSN: [id number] Arrival date & time: 03/02/17  0455     History   Chief Complaint Chief Complaint  Patient presents with  . Back Pain    HPI DERAL SCHELLENBERG is a 31 y.o. male.  The history is provided by the patient and medical records. No language interpreter was used.    DOC MANDALA is a 31 y.o. male  with a PMH of asthma, HTN who presents to the Emergency Department complaining of right low back spasm which began last night while at work. 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 He notes similar incident about two weeks ago where he was seen at [facility]. He was given ibuprofen and flexeril along with a work note and states the rest and medications helped. All symptoms resolved in a few days. No medications were taken prior to arrival today as he came straight from work. He does still have Flexeril from last visit. Denies upper back or neck pain, Denies fever, saddle anesthesia, weakness, numbness, urinary complaints including retention/incontinence. No history of cancer, IVDU, or recent spinal procedures.   Past Medical History:  Diagnosis Date  . Asthma   . Hypertension     Patient Active Problem List   Diagnosis Date Noted  . Childhood asthma 02/24/2014  . RHINITIS, ALLERGIC 01/10/2007  . ACNE 01/10/2007  . BICIPITAL TENDONITIS 01/10/2007    Past Surgical History:  Procedure Laterality Date  . MOUTH SURGERY         Home Medications    Prior to Admission medications   Medication Sig Start Date End Date Taking? Authorizing Provider  cyclobenzaprine (FLEXERIL) 10 MG tablet Take 1 tablet (10 mg total) by mouth 2 (two) times daily as needed for muscle spasms. 03/02/17   Chase Picket Ninamarie Keel, PA-C  ibuprofen (ADVIL,MOTRIN) 600 MG tablet Take 1 tablet (600 mg total) by mouth every 8 (eight) hours as needed. 03/02/17   Candas Deemer Pilcher  Cleva Camero, PA-C  naproxen sodium (ANAPROX DS) 550 MG tablet Take 1 tablet (550 mg total) by mouth 2 (two) times daily with a meal. Patient not taking: Reported on 06/14/2016 08/31/15   Dorna Leitz, PA-C  pseudoephedrine (SUDAFED 12 HOUR) 120 MG 12 hr tablet Take 1 tablet (120 mg total) by mouth 2 (two) times daily. 10/09/16   Deborha Payment, PA-C    Family History Family History  Problem Relation Age of Onset  . Heart attack Father   . Heart disease Father   . Hypertension Sister   . Hypertension Mother   . Diabetes Mother     Social History Social History  Substance Use Topics  . Smoking status: Never Smoker  . Smokeless tobacco: Never Used  . Alcohol use Yes     Comment: occassional     Allergies   Penicillins   Review of Systems Review of Systems  Constitutional: Negative for fever.  Genitourinary: Negative for difficulty urinating.  Musculoskeletal: Positive for back pain and myalgias. Negative for neck pain.  Skin: Negative for color change.  Neurological: Negative for weakness and numbness.     Physical Exam Updated Vital Signs BP 129/85 (BP Location: Left Arm)   Pulse 66   Temp 97.6 F (36.4 C) (Oral)   Resp 18   Ht 6' (1.829 m)   Wt 99.8 kg   SpO2 100%   BMI 29.84 kg/m   Physical Exam  Constitutional: He is oriented to person, place, and time. He appears well-developed and well-nourished.  Neck:  Full ROM without pain No midline tenderness No tenderness of paraspinal musculature  Cardiovascular: Normal rate, regular rhythm, normal heart sounds and intact distal pulses.  Exam reveals no gallop and no friction rub.   No murmur heard. Pulmonary/Chest: Effort normal and breath sounds normal. No respiratory distress. He has no wheezes. He has no rales.  Abdominal: Soft. Bowel sounds are normal. He exhibits no distension. There is no tenderness.  Musculoskeletal:  No midline tenderness; tenderness to palpation of right lumbar paraspinal musculature. Full  ROM. Patient is able to ambulate without difficulty. Straight leg raises are negative bilaterally for radicular symptoms. 5/5 muscle strength of bilateral LE's.  Neurological: He is alert and oriented to person, place, and time. He has normal reflexes.  Bilateral lower extremities neurovascularly intact.  Skin: Skin is warm and dry. No rash noted. No erythema.  Nursing note and vitals reviewed.    ED Treatments / Results  Labs (all labs ordered are listed, but only abnormal results are displayed) Labs Reviewed - No data to display  EKG  EKG Interpretation None       Radiology No results found.  Procedures Procedures (including critical care time)  Medications Ordered in ED Medications  lidocaine (LIDODERM) 5 % 1 patch (not administered)  ketorolac (TORADOL) 15 MG/ML injection 15 mg (not administered)     Initial Impression / Assessment and Plan / ED Course  I have reviewed the triage vital signs and the nursing notes.  Pertinent labs & imaging results that were available during my care of the patient were reviewed by me and considered in my medical decision making (see chart for details).    [name] presents with right low back pain c/w musk etiology. Patient demonstrates no lower extremity weakness, saddle anesthesia, bowel or bladder incontinence, or neuro deficits. No concern for cauda equina. No fevers or other infectious symptoms to suggest that the patient's back pain is due to an infection. Lower extremities are neurovascularly intact and patient is ambulating without difficulty. Will give lidocaine patch and toradol in ED. Flexeril and ibuprofen rx given. I have reviewed return precautions, including the development of any of these signs or symptoms, and the patient has voiced understanding. PCP follow-up if symptoms do not improve. Patient voiced understanding and agreement with plan.    Final Clinical Impressions(s) / ED Diagnoses   Final diagnoses:    Strain of lumbar region, initial encounter    New Prescriptions New Prescriptions   CYCLOBENZAPRINE (FLEXERIL) 10 MG TABLET    Take 1 tablet (10 mg total) by mouth 2 (two) times daily as needed for muscle spasms.   IBUPROFEN (ADVIL,MOTRIN) 600 MG TABLET    Take 1 tablet (600 mg total) by mouth every 8 (eight) hours as needed.     [facility] Roshawnda Pecora, PA-C 03/02/17 0732    Azalia Bilis, MD 03/02/17 [phone]

## 2017-03-02 NOTE — Discharge Instructions (Signed)
It was my pleasure taking care of you today!   Ibuprofen as needed for pain. Flexeril is your muscle relaxer to take as needed - This can make you very drowsy - please do not drink alcohol, operate heavy machinery or drive on this medication.   Your back pain should get better over the next 2 weeks. Please follow up with your doctor this week for a recheck if still having symptoms. If you develop severe or worsening pain, low back pain with fever, numbness, weakness or inability to walk or urinate, you should return to the ER immediately.   COLD THERAPY DIRECTIONS:  Ice or gel packs can be used to reduce both pain and swelling. Ice is the most helpful within the first 24 to 48 hours after an injury or flareup from overusing a muscle or joint.  Ice is effective, has very few side effects, and is safe for most people to use.   If you expose your skin to cold temperatures for too long or without the proper protection, you can damage your skin or nerves. Watch for signs of skin damage due to cold.   HOME CARE INSTRUCTIONS  Follow these tips to use ice and cold packs safely.  Place a dry or damp towel between the ice and skin. A damp towel will cool the skin more quickly, so you may need to shorten the time that the ice is used.  For a more rapid response, add gentle compression to the ice.  Ice for no more than 10 to 20 minutes at a time. The bonier the area you are icing, the less time it will take to get the benefits of ice.  Check your skin after 5 minutes to make sure there are no signs of a poor response to cold or skin damage.  Rest 20 minutes or more in between uses.  Once your skin is numb, you can end your treatment. You can test numbness by very lightly touching your skin. The touch should be so light that you do not see the skin dimple from the pressure of your fingertip. When using ice, most people will feel these normal sensations in this order: cold, burning, aching, and numbness.     Be aware that if you develop new symptoms, such as a fever, leg weakness, difficulty with or loss of control of your urine or bowels, abdominal pain, or more severe pain, you will need to seek medical attention and  / or return to the Emergency department.

## 2017-03-02 NOTE — ED Notes (Signed)
Bed: WA23 Expected date:  Expected time:  Means of arrival:  Comments: 

## 2017-03-02 NOTE — ED Triage Notes (Addendum)
Pt having back spasms on lower right side of back since tonight. Denies any recent physical extertion. Was seen two weeks ago for the same. Pain is worse when pt walks.

## 2017-06-29 DIAGNOSIS — I10 Essential (primary) hypertension: Secondary | ICD-10-CM | POA: Insufficient documentation

## 2017-06-29 DIAGNOSIS — R51 Headache: Secondary | ICD-10-CM | POA: Insufficient documentation

## 2017-06-29 DIAGNOSIS — J45909 Unspecified asthma, uncomplicated: Secondary | ICD-10-CM | POA: Insufficient documentation

## 2017-06-30 ENCOUNTER — Encounter (HOSPITAL_COMMUNITY): Payer: Self-pay | Admitting: Emergency Medicine

## 2017-06-30 ENCOUNTER — Emergency Department (HOSPITAL_COMMUNITY)
Admission: EM | Admit: 2017-06-30 | Discharge: 2017-06-30 | Disposition: A | Discharge status: Home / Self Care | Payer: Self-pay | Attending: Emergency Medicine | Admitting: Emergency Medicine

## 2017-06-30 DIAGNOSIS — R51 Headache: Secondary | ICD-10-CM

## 2017-06-30 DIAGNOSIS — R519 Headache, unspecified: Secondary | ICD-10-CM

## 2017-06-30 MED ORDER — BUTALBITAL-APAP-CAFFEINE 50-325-40 MG PO TABS: 1.0000 | ORAL_TABLET | Freq: Three times a day (TID) | ORAL | 0 refills | Status: DC | PRN

## 2017-06-30 MED ORDER — KETOROLAC TROMETHAMINE 30 MG/ML IJ SOLN
30.0000 mg | Freq: Once | INTRAMUSCULAR | Status: AC
  Administered 2017-06-30: 30 mg via INTRAVENOUS
  Filled 2017-06-30: qty 1

## 2017-06-30 MED ORDER — SODIUM CHLORIDE 0.9 % IV BOLUS (SEPSIS)
1000.0000 mL | Freq: Once | INTRAVENOUS | Status: AC
Start: 2017-06-30 — End: 2017-06-30
  Administered 2017-06-30: 1000 mL via INTRAVENOUS

## 2017-06-30 MED ORDER — METOCLOPRAMIDE HCL 5 MG/ML IJ SOLN
10.0000 mg | INTRAMUSCULAR | Status: AC
  Administered 2017-06-30: 10 mg via INTRAVENOUS
  Filled 2017-06-30: qty 2

## 2017-06-30 MED ORDER — FENTANYL CITRATE (PF) 100 MCG/2ML IJ SOLN
50.0000 ug | INTRAMUSCULAR | Status: DC | PRN
  Filled 2017-06-30: qty 2

## 2017-06-30 NOTE — ED Notes (Signed)
Bed: WTR7 Expected date:  Expected time:  Means of arrival:  Comments: 

## 2017-06-30 NOTE — ED Provider Notes (Signed)
[facility]-EMERGENCY DEPT Provider Note   CSN: [id number] Arrival date & time: 06/29/17  2339     History   Chief Complaint Chief Complaint  Patient presents with  . Migraine    HPI MARCELLA BRASSARD is a 31 y.o. male.  31 year old male presents to the emergency department for evaluation of a headache which has been persistent and intermittent over the past week. He states that pain is located in his bilateral temples. He describes his pain as throbbing. He took ibuprofen previously with little relief. Patient has not noted any aggravating factors of his pain. He does note a history of similar headaches. Symptoms associated with photophobia. No vision changes or vision loss. Patient denies associated nausea, vomiting, extremity numbness, paresthesias, or extremity weakness. No recent fevers.      Past Medical History:  Diagnosis Date  . Asthma   . Hypertension     Patient Active Problem List   Diagnosis Date Noted  . Childhood asthma 02/24/2014  . RHINITIS, ALLERGIC 01/10/2007  . ACNE 01/10/2007  . BICIPITAL TENDONITIS 01/10/2007    Past Surgical History:  Procedure Laterality Date  . MOUTH SURGERY         Home Medications    Prior to Admission medications   Medication Sig Start Date End Date Taking? Authorizing Provider  ibuprofen (ADVIL,MOTRIN) 200 MG tablet Take 400 mg by mouth every 6 (six) hours as needed for headache, mild pain or moderate pain.   Yes [provider]  butalbital-acetaminophen-caffeine (FIORICET, ESGIC) 50-325-40 MG tablet Take 1-2 tablets by mouth every 8 (eight) hours as needed for headache. 06/30/17   Antony Madura, PA-C  cyclobenzaprine (FLEXERIL) 10 MG tablet Take 1 tablet (10 mg total) by mouth 2 (two) times daily as needed for muscle spasms. Patient not taking: Reported on 06/30/2017 03/02/17   Ward, Chase Picket, PA-C  ibuprofen (ADVIL,MOTRIN) 600 MG tablet Take 1 tablet (600 mg total) by mouth every 8 (eight) hours as  needed. Patient not taking: Reported on 06/30/2017 03/02/17   Ward, Chase Picket, PA-C  naproxen sodium (ANAPROX DS) 550 MG tablet Take 1 tablet (550 mg total) by mouth 2 (two) times daily with a meal. Patient not taking: Reported on 06/14/2016 08/31/15   Dorna Leitz, PA-C    Family History Family History  Problem Relation Age of Onset  . Heart attack Father   . Heart disease Father   . Hypertension Sister   . Hypertension Mother   . Diabetes Mother     Social History Social History  Substance Use Topics  . Smoking status: Never Smoker  . Smokeless tobacco: Never Used  . Alcohol use Yes     Comment: occassional     Allergies   Penicillins   Review of Systems Review of Systems Ten systems reviewed and are negative for acute change, except as noted in the HPI.    Physical Exam Updated Vital Signs BP 124/79 (BP Location: Right Arm)   Pulse 69   Temp 97.8 F (36.6 C) (Oral)   Resp 18   Ht 6' (1.829 m)   Wt 99.8 kg (220 lb)   SpO2 100%   BMI 29.84 kg/m   Physical Exam  Constitutional: He is oriented to person, place, and time. He appears well-developed and well-nourished. No distress.  Nontoxic and in NAD  HENT:  Head: Normocephalic and atraumatic.  Eyes: Conjunctivae and EOM are normal. No scleral icterus.  Neck: Normal range of motion.  No nuchal rigidity or meningismus  Cardiovascular: Normal rate, regular rhythm and intact distal pulses.   Pulmonary/Chest: Effort normal. No respiratory distress.  Respirations even and unlabored  Musculoskeletal: Normal range of motion.  Neurological: He is alert and oriented to person, place, and time. No cranial nerve deficit. He exhibits normal muscle tone. Coordination normal.  GCS 15. Speech is goal oriented. No cranial nerve deficits appreciated; symmetric eyebrow raise, no facial drooping, tongue midline. Patient has equal grip strength bilaterally with 5/5 strength against resistance in all major muscle groups  bilaterally. Sensation to light touch intact. Patient moves extremities without ataxia. Patient ambulatory with steady gait.  Skin: Skin is warm and dry. No rash noted. He is not diaphoretic. No erythema. No pallor.  Psychiatric: He has a normal mood and affect. His behavior is normal.  Nursing note and vitals reviewed.    ED Treatments / Results  Labs (all labs ordered are listed, but only abnormal results are displayed) Labs Reviewed - No data to display  EKG  EKG Interpretation None       Radiology No results found.  Procedures Procedures (including critical care time)  Medications Ordered in ED Medications  fentaNYL (SUBLIMAZE) injection 50 mcg (not administered)  ketorolac (TORADOL) 30 MG/ML injection 30 mg (30 mg Intravenous Given 06/30/17 0541)  metoCLOPramide (REGLAN) injection 10 mg (10 mg Intravenous Given 06/30/17 0541)  sodium chloride 0.9 % bolus 1,000 mL (1,000 mLs Intravenous New Bag/Given 06/30/17 0541)     Initial Impression / Assessment and Plan / ED Course  I have reviewed the triage vital signs and the nursing notes.  Pertinent labs & imaging results that were available during my care of the patient were reviewed by me and considered in my medical decision making (see chart for details).     31 year old male presents to the emergency department for a bilateral temporal headache which is throbbing and consistent with past episodes of migraine headache. He reports mild photophobia. Nonfocal neurologic exam noted. Patient afebrile and without nuchal rigidity or meningismus. Doubt meningitis. He has had complete resolution of his headache following Toradol, Reglan, and fluids. He states that he feels comfortable managing his symptoms further on an outpatient basis. Low suspicion for emergent intracranial etiology; doubt SAH, temporal arteritis. Will discharge with prescription for Fioricet. Return precautions discussed and provided. Patient discharged in stable  condition with no unaddressed concerns.   Vitals:   06/30/17 0000 06/30/17 0054 06/30/17 0408  BP: 131/73  124/79  Pulse: 62  69  Resp: 18  18  Temp: 98.1 F (36.7 C)  97.8 F (36.6 C)  TempSrc: Oral  Oral  SpO2: 99%  100%  Weight:  99.8 kg (220 lb)   Height:  6' (1.829 m)     Final Clinical Impressions(s) / ED Diagnoses   Final diagnoses:  Bad headache    New Prescriptions New Prescriptions   BUTALBITAL-ACETAMINOPHEN-CAFFEINE (FIORICET, ESGIC) 50-325-40 MG TABLET    Take 1-2 tablets by mouth every 8 (eight) hours as needed for headache.     Antony Madura, PA-C 06/30/17 1610    Derwood Kaplan, MD 07/02/17 1430

## 2017-10-27 ENCOUNTER — Emergency Department (HOSPITAL_COMMUNITY)
Admission: EM | Admit: 2017-10-27 | Discharge: 2017-10-27 | Disposition: A | Discharge status: Home / Self Care | Payer: Self-pay | Attending: Emergency Medicine | Admitting: Emergency Medicine

## 2017-10-27 ENCOUNTER — Encounter (HOSPITAL_COMMUNITY): Payer: Self-pay | Admitting: Emergency Medicine

## 2017-10-27 DIAGNOSIS — I10 Essential (primary) hypertension: Secondary | ICD-10-CM | POA: Insufficient documentation

## 2017-10-27 DIAGNOSIS — Y939 Activity, unspecified: Secondary | ICD-10-CM | POA: Insufficient documentation

## 2017-10-27 DIAGNOSIS — J45909 Unspecified asthma, uncomplicated: Secondary | ICD-10-CM | POA: Insufficient documentation

## 2017-10-27 DIAGNOSIS — Y998 Other external cause status: Secondary | ICD-10-CM | POA: Insufficient documentation

## 2017-10-27 DIAGNOSIS — S39012A Strain of muscle, fascia and tendon of lower back, initial encounter: Secondary | ICD-10-CM | POA: Insufficient documentation

## 2017-10-27 DIAGNOSIS — Y33XXXA Other specified events, undetermined intent, initial encounter: Secondary | ICD-10-CM | POA: Insufficient documentation

## 2017-10-27 DIAGNOSIS — Y929 Unspecified place or not applicable: Secondary | ICD-10-CM | POA: Insufficient documentation

## 2017-10-27 MED ORDER — CYCLOBENZAPRINE HCL 10 MG PO TABS: 10.0000 mg | ORAL_TABLET | Freq: Two times a day (BID) | ORAL | 0 refills | Status: DC | PRN

## 2017-10-27 MED ORDER — IBUPROFEN 600 MG PO TABS: 600.0000 mg | ORAL_TABLET | Freq: Three times a day (TID) | ORAL | 0 refills | Status: DC | PRN

## 2017-10-27 NOTE — ED Provider Notes (Signed)
[facility] Provider Note   CSN: [id number] Arrival date & time: 10/27/17  1754     History   Chief Complaint Chief Complaint  Patient presents with  . Back Pain    HPI Rodney FenderReginald D Nunez is a 31 y.o. male.  HPI   31 year old male presenting for evaluation of back pain.  Patient states he works for a [employer] and does heavy lifting.  He has had recurrent lower back pain which has become progressively worse within the past week.  He describes his pain as a tightness and aching sensation across his lower back worse at nighttime when he try to sleep.  Pain mildly improved with using Biofreeze.  Pain does not radiates down his leg.  There is no associated fever, chills, abdominal pain, dysuria, hematuria, bowel bladder incontinence or saddle anesthesia.  No history of IV drug use or active cancer.  He is able to ambulate.  Pain is rated as mild to moderate at this time.  Past Medical History:  Diagnosis Date  . Asthma   . Hypertension     Patient Active Problem List   Diagnosis Date Noted  . Childhood asthma 02/24/2014  . RHINITIS, ALLERGIC 01/10/2007  . ACNE 01/10/2007  . BICIPITAL TENDONITIS 01/10/2007    Past Surgical History:  Procedure Laterality Date  . MOUTH SURGERY         Home Medications    Prior to Admission medications   Medication Sig Start Date End Date Taking? Authorizing Provider  butalbital-acetaminophen-caffeine (FIORICET, ESGIC) [phone] MG tablet Take 1-2 tablets by mouth every 8 (eight) hours as needed for headache. 06/30/17   Antony MaduraHumes, Kelly, PA-C  cyclobenzaprine (FLEXERIL) 10 MG tablet Take 1 tablet (10 mg total) by mouth 2 (two) times daily as needed for muscle spasms. Patient not taking: Reported on 06/30/2017 03/02/17   Ward, Chase PicketJaime Pilcher, PA-C  ibuprofen (ADVIL,MOTRIN) 200 MG tablet Take 400 mg by mouth every 6 (six) hours as needed for headache, mild pain or moderate pain.    [provider]  ibuprofen (ADVIL,MOTRIN) 600 MG tablet Take 1 tablet (600 mg total) by mouth every 8 (eight) hours as needed. Patient not taking: Reported on 06/30/2017 03/02/17   Ward, Chase PicketJaime Pilcher, PA-C  naproxen sodium (ANAPROX DS) 550 MG tablet Take 1 tablet (550 mg total) by mouth 2 (two) times daily with a meal. Patient not taking: Reported on 06/14/2016 08/31/15   Dorna LeitzBush, Nicole V, PA-C    Family History Family History  Problem Relation Age of Onset  . Heart attack Father   . Heart disease Father   . Hypertension Sister   . Hypertension Mother   . Diabetes Mother     Social History Social History   Tobacco Use  . Smoking status: Never Smoker  . Smokeless tobacco: Never Used  Substance Use Topics  . Alcohol use: Yes    Comment: occassional  . Drug use: No     Allergies   Penicillins   Review of Systems Review of Systems  Constitutional: Negative for fever.  Genitourinary: Negative for dysuria.  Musculoskeletal: Positive for back pain. Negative for gait problem.  Neurological: Negative for numbness.     Physical Exam Updated Vital Signs BP 122/74   Pulse 78   Temp 97.8 F (36.6 C)   Resp 17   Ht 6' (1.829 m)   Wt 99.8 kg (220 lb)   SpO2 100%   BMI 29.84 kg/m   Physical Exam  Constitutional: He appears well-developed and well-nourished. No distress.  HENT:  Head: Atraumatic.  Eyes: Conjunctivae are normal.  Neck: Neck supple.  Musculoskeletal: He exhibits tenderness (Low back: Tenderness to paralumbar spinal muscle on palpation with full range of motion throughout entire midline spine.  Equal strength to bilateral lower extremities with intact distal pulses.).  Neurological: He is alert. He displays normal reflexes.  Ambulate without difficulty.  Skin: No rash noted.  Psychiatric: He has a normal mood and affect.  Nursing note and vitals reviewed.    ED Treatments / Results  Labs (all labs ordered are listed, but only abnormal results are  displayed) Labs Reviewed - No data to display  EKG  EKG Interpretation None       Radiology No results found.  Procedures Procedures (including critical care time)  Medications Ordered in ED Medications - No data to display   Initial Impression / Assessment and Plan / ED Course  I have reviewed the triage vital signs and the nursing notes.  Pertinent labs & imaging results that were available during my care of the patient were reviewed by me and considered in my medical decision making (see chart for details).     BP 122/74   Pulse 78   Temp 97.8 F (36.6 C)   Resp 17   Ht 6' (1.829 m)   Wt 99.8 kg (220 lb)   SpO2 100%   BMI 29.84 kg/m    Final Clinical Impressions(s) / ED Diagnoses   Final diagnoses:  Strain of lumbar region, initial encounter    ED Discharge Orders    None     No red flag s/s of low back pain. Patient was counseled on back pain precautions and told to do activity as tolerated but do not lift, push, or pull heavy objects more than 10 pounds for the next week. Patient counseled to use ice or heat on back for no longer than 15 minutes every hour.   Patient prescribed muscle relaxer and counseled on proper use of muscle relaxant medication.   Patient urged to follow-up with PCP if pain does not improve with treatment and rest or if pain becomes recurrent. Urged to return with worsening severe pain, loss of bowel or bladder control, trouble walking.  The patient verbalizes understanding and agrees with the plan.    Fayrene Helperran, Courteney Alderete, PA-C 10/27/17 Prentice [id number]    Raeford RazorKohut, Stephen, MD 10/30/17 1130

## 2017-10-27 NOTE — ED Triage Notes (Signed)
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

## 2017-11-04 ENCOUNTER — Emergency Department (HOSPITAL_BASED_OUTPATIENT_CLINIC_OR_DEPARTMENT_OTHER)
Admission: EM | Admit: 2017-11-04 | Discharge: 2017-11-04 | Disposition: A | Discharge status: Home / Self Care | Payer: Worker's Compensation | Attending: Emergency Medicine | Admitting: Emergency Medicine

## 2017-11-04 ENCOUNTER — Other Ambulatory Visit: Payer: Self-pay

## 2017-11-04 ENCOUNTER — Encounter (HOSPITAL_BASED_OUTPATIENT_CLINIC_OR_DEPARTMENT_OTHER): Payer: Self-pay | Admitting: Emergency Medicine

## 2017-11-04 ENCOUNTER — Emergency Department (HOSPITAL_BASED_OUTPATIENT_CLINIC_OR_DEPARTMENT_OTHER): Payer: Worker's Compensation

## 2017-11-04 DIAGNOSIS — I10 Essential (primary) hypertension: Secondary | ICD-10-CM | POA: Diagnosis not present

## 2017-11-04 DIAGNOSIS — Y929 Unspecified place or not applicable: Secondary | ICD-10-CM | POA: Insufficient documentation

## 2017-11-04 DIAGNOSIS — S8991XA Unspecified injury of right lower leg, initial encounter: Secondary | ICD-10-CM | POA: Diagnosis present

## 2017-11-04 DIAGNOSIS — S8391XA Sprain of unspecified site of right knee, initial encounter: Secondary | ICD-10-CM | POA: Diagnosis not present

## 2017-11-04 DIAGNOSIS — W228XXA Striking against or struck by other objects, initial encounter: Secondary | ICD-10-CM | POA: Insufficient documentation

## 2017-11-04 DIAGNOSIS — J45909 Unspecified asthma, uncomplicated: Secondary | ICD-10-CM | POA: Diagnosis not present

## 2017-11-04 DIAGNOSIS — Y990 Civilian activity done for income or pay: Secondary | ICD-10-CM | POA: Insufficient documentation

## 2017-11-04 DIAGNOSIS — Y939 Activity, unspecified: Secondary | ICD-10-CM | POA: Insufficient documentation

## 2017-11-04 MED ORDER — NAPROXEN 500 MG PO TABS: 500.0000 mg | ORAL_TABLET | Freq: Two times a day (BID) | ORAL | 0 refills | Status: DC

## 2017-11-04 MED ORDER — IBUPROFEN 800 MG PO TABS
800.0000 mg | ORAL_TABLET | Freq: Once | ORAL | Status: AC
  Administered 2017-11-04: 800 mg via ORAL
  Filled 2017-11-04: qty 1

## 2017-11-04 NOTE — ED Notes (Signed)
Patient transported to X-ray 

## 2017-11-04 NOTE — ED Triage Notes (Signed)
Pt presents with c/o right knee pain for one day

## 2017-11-04 NOTE — ED Provider Notes (Signed)
[facility] EMERGENCY DEPARTMENT Provider Note   CSN: [id number] Arrival date & time: 11/04/17  0050     History   Chief Complaint Chief Complaint  Patient presents with  . Knee Pain    HPI Rodney Nunez is a 31 y.o. male.  The history is provided by the patient.  He states that he suffered an injury to his right knee at work.  He jumped down on a palate and hyperextended his right knee.  He is not having pain at rest, but pain is significant when he tries to walk.  He rates pain at 9/10.  He denies other injury.  Past Medical History:  Diagnosis Date  . Asthma   . Hypertension     Patient Active Problem List   Diagnosis Date Noted  . Childhood asthma 02/24/2014  . RHINITIS, ALLERGIC 01/10/2007  . ACNE 01/10/2007  . BICIPITAL TENDONITIS 01/10/2007    Past Surgical History:  Procedure Laterality Date  . MOUTH SURGERY         Home Medications    Prior to Admission medications   Medication Sig Start Date End Date Taking? Authorizing Provider  butalbital-acetaminophen-caffeine (FIORICET, ESGIC) [phone] MG tablet Take 1-2 tablets by mouth every 8 (eight) hours as needed for headache. 06/30/17   Antony MaduraHumes, Kelly, PA-C  cyclobenzaprine (FLEXERIL) 10 MG tablet Take 1 tablet (10 mg total) by mouth 2 (two) times daily as needed for muscle spasms. 10/27/17   Fayrene Helperran, Bowie, PA-C  ibuprofen (ADVIL,MOTRIN) 600 MG tablet Take 1 tablet (600 mg total) by mouth every 8 (eight) hours as needed for mild pain or moderate pain. 10/27/17   Fayrene Helperran, Bowie, PA-C    Family History Family History  Problem Relation Age of Onset  . Heart attack Father   . Heart disease Father   . Hypertension Sister   . Hypertension Mother   . Diabetes Mother     Social History Social History   Tobacco Use  . Smoking status: Never Smoker  . Smokeless tobacco: Never Used  Substance Use Topics  . Alcohol use: Yes    Comment: occassional  . Drug use: No     Allergies     Penicillins   Review of Systems Review of Systems  All other systems reviewed and are negative.    Physical Exam Updated Vital Signs BP (!) 138/96 (BP Location: Left Arm)   Pulse 71   Temp 98 F (36.7 C) (Oral)   Resp 18   Ht 5\' 11"  (1.803 m)   Wt 99.8 kg (220 lb)   SpO2 100%   BMI 30.68 kg/m   Physical Exam  Nursing note and vitals reviewed.  31 year old male, resting comfortably and in no acute distress. Vital signs are significant for hypertension. Oxygen saturation is 100%, which is normal. Head is normocephalic and atraumatic. PERRLA, EOMI. Oropharynx is clear. Neck is nontender and supple without adenopathy or JVD. Back is nontender and there is no CVA tenderness. Lungs are clear without rales, wheezes, or rhonchi. Chest is nontender. Heart has regular rate and rhythm without murmur. Abdomen is soft, flat, nontender without masses or hepatosplenomegaly and peristalsis is normoactive. Extremities: There is no swelling or effusion of the right knee.  There is tenderness over the patellar tendon and over the lateral aspect of the joint.  There is no instability on valgus or varus stress, but pain is elicited on varus stress.  Lachman and McMurray's tests are negative.  Pain is elicited with passive  flexion of the knee and with extension against resistance.  Distal neurovascular exam is intact with strong pulses, prompt capillary refill, normal sensation. Skin is warm and dry without rash. Neurologic: Mental status is normal, cranial nerves are intact, there are no motor or sensory deficits.  ED Treatments / Results   Radiology Dg Knee Complete 4 Views Right  Result Date: 11/04/2017 CLINICAL DATA:  31 year old male with fall and right knee pain. EXAM: RIGHT KNEE - COMPLETE 4+ VIEW COMPARISON:  Right knee radiograph dated 08/31/2015 FINDINGS: No evidence of fracture, dislocation, or joint effusion. No evidence of arthropathy or other focal bone abnormality. Soft tissues  are unremarkable. IMPRESSION: Negative. Electronically Signed   By: Elgie CollardArash  Radparvar M.D.   On: 11/04/2017 02:20    Procedures Procedures (including critical care time)  Medications Ordered in ED Medications  ibuprofen (ADVIL,MOTRIN) tablet 800 mg (800 mg Oral Given 11/04/17 0203)     Initial Impression / Assessment and Plan / ED Course  I have reviewed the triage vital signs and the nursing notes.  Pertinent imaging results that were available during my care of the patient were reviewed by me and considered in my medical decision making (see chart for details).  Right knee injury which appears to be predominantly strain of the patellar tendon.  He is sent for x-rays.  Old records are reviewed, and he has several other ED visits for work-related injuries.  X-rays showed no acute process.  He is placed in a knee immobilizer and given crutches to use as needed.  Discharged with work restrictions for the next 5 days, referred to sports medicine for follow-up.  Given prescription for naproxen, told to use over-the-counter acetaminophen as needed for additional pain relief.  Final Clinical Impressions(s) / ED Diagnoses   Final diagnoses:  Sprain of right knee, unspecified ligament, initial encounter    ED Discharge Orders        Ordered    naproxen (NAPROSYN) 500 MG tablet  2 times daily     11/04/17 0238       Dione BoozeGlick, Quinetta Shilling, MD 11/04/17 [phone]

## 2017-11-04 NOTE — ED Notes (Signed)
Pt states he was at work and jumped up to "push some product back" and came down on a pallet, injuring his right knee. C/O pain to same. +dpp palp. Moves toes. Feels touch. Cap refill < 3 sec.

## 2017-11-04 NOTE — Discharge Instructions (Signed)
Apply ice several times a day.  Wear the knee immobilizer as needed. Use the crutches as needed.  Take acetaminophen (Tylenol) as needed for additional pain relief.

## 2017-11-04 NOTE — ED Notes (Signed)
ED Provider at bedside. 

## 2017-11-04 NOTE — ED Notes (Signed)
Pt given d/c instructions as per chart. Rx x 1. Verbalizes understanding. No questions. 

## 2018-01-05 ENCOUNTER — Emergency Department (HOSPITAL_COMMUNITY)
Admission: EM | Admit: 2018-01-05 | Discharge: 2018-01-05 | Disposition: A | Discharge status: Home / Self Care | Payer: Managed Care, Other (non HMO) | Attending: Emergency Medicine | Admitting: Emergency Medicine

## 2018-01-05 ENCOUNTER — Emergency Department (HOSPITAL_COMMUNITY): Payer: Managed Care, Other (non HMO)

## 2018-01-05 ENCOUNTER — Encounter (HOSPITAL_COMMUNITY): Payer: Self-pay | Admitting: Emergency Medicine

## 2018-01-05 DIAGNOSIS — R070 Pain in throat: Secondary | ICD-10-CM | POA: Diagnosis not present

## 2018-01-05 DIAGNOSIS — M791 Myalgia, unspecified site: Secondary | ICD-10-CM | POA: Insufficient documentation

## 2018-01-05 DIAGNOSIS — R6889 Other general symptoms and signs: Secondary | ICD-10-CM | POA: Diagnosis not present

## 2018-01-05 DIAGNOSIS — R509 Fever, unspecified: Secondary | ICD-10-CM | POA: Insufficient documentation

## 2018-01-05 DIAGNOSIS — R05 Cough: Secondary | ICD-10-CM | POA: Diagnosis not present

## 2018-01-05 DIAGNOSIS — R0981 Nasal congestion: Secondary | ICD-10-CM | POA: Insufficient documentation

## 2018-01-05 MED ORDER — BENZONATATE 100 MG PO CAPS: 100.0000 mg | ORAL_CAPSULE | Freq: Three times a day (TID) | ORAL | 0 refills | Status: DC

## 2018-01-05 MED ORDER — PROMETHAZINE-DM 6.25-15 MG/5ML PO SYRP: 5.0000 mL | ORAL_SOLUTION | Freq: Four times a day (QID) | ORAL | 0 refills | Status: DC | PRN

## 2018-01-05 MED ORDER — FLUTICASONE PROPIONATE 50 MCG/ACT NA SUSP: 2.0000 | Freq: Every day | NASAL | 0 refills | Status: DC

## 2018-01-05 NOTE — Discharge Instructions (Signed)
You likely have a viral illness.  This should be treated symptomatically. Use Tylenol or ibuprofen as needed for fevers or body aches. Use Flonase daily for nasal congestion and cough. Use cough drops and syrup as needed.  Make sure you stay well-hydrated with water. Wash your hands frequently to prevent spread of infection. Follow-up with your primary care doctor in 1 week if your symptoms are not improving. Return to the emergency room if you develop chest pain, difficulty breathing, or any new or worsening symptoms.

## 2018-01-05 NOTE — ED Triage Notes (Signed)
Pt to ER for evaluation of flu like symptoms for 2 days - reports headache, generalized body aches, sore throat, and cough. Afebrile at present. NAD

## 2018-01-05 NOTE — ED Notes (Signed)
Pt understood dc material. NAD noted. 

## 2018-01-06 NOTE — ED Provider Notes (Signed)
[facility] EMERGENCY DEPARTMENT Provider Note   CSN: [id number] Arrival date & time: 01/05/18  1826     History   Chief Complaint Chief Complaint  Patient presents with  . Influenza    HPI Rodney Nunez is a 32 y.o. male presenting for evaluation of fevers, generalized body aches, nasal congestion, sore throat, and cough.  Patient states that his symptoms started 2 days ago.  He reports generalized body aches and fevers.  He has nasal congestion, rhinorrhea, and ST.  Cough is nonproductive, worse at night.  Patient reports a frontal headache, described as a pressure.  He took Tylenol last night without improvement of his symptoms.  He has not tried anything else.  Patient's coworker diagnosed with flu several days ago, and multiple family members with URI symptoms last week.  Patient denies ear pain, eye pain, chest pain, difficulty breathing, nausea, vomiting, abdominal pain, urinary symptoms, abnormal bowel movements.  Has a history of asthma as a child, but has not needed an inhaler for many years.  No other medical problems, does not take medications daily.  HPI  Past Medical History:  Diagnosis Date  . Asthma   . Hypertension     Patient Active Problem List   Diagnosis Date Noted  . Childhood asthma 02/24/2014  . RHINITIS, ALLERGIC 01/10/2007  . ACNE 01/10/2007  . BICIPITAL TENDONITIS 01/10/2007    Past Surgical History:  Procedure Laterality Date  . MOUTH SURGERY         Home Medications    Prior to Admission medications   Medication Sig Start Date End Date Taking? Authorizing Provider  benzonatate (TESSALON) 100 MG capsule Take 1 capsule (100 mg total) by mouth every 8 (eight) hours. 01/05/18   Cameshia Cressman, PA-C  butalbital-acetaminophen-caffeine (FIORICET, ESGIC) [phone] MG tablet Take 1-2 tablets by mouth every 8 (eight) hours as needed for headache. 06/30/17   Antony Madura, PA-C  cyclobenzaprine (FLEXERIL) 10 MG tablet Take 1  tablet (10 mg total) by mouth 2 (two) times daily as needed for muscle spasms. 10/27/17   Fayrene Helper, PA-C  fluticasone (FLONASE) 50 MCG/ACT nasal spray Place 2 sprays into both nostrils daily. 01/05/18   Devan Babino, PA-C  ibuprofen (ADVIL,MOTRIN) 600 MG tablet Take 1 tablet (600 mg total) by mouth every 8 (eight) hours as needed for mild pain or moderate pain. 10/27/17   Fayrene Helper, PA-C  naproxen (NAPROSYN) 500 MG tablet Take 1 tablet (500 mg total) by mouth 2 (two) times daily. 11/04/17   Dione Booze, MD  promethazine-dextromethorphan (PROMETHAZINE-DM) 6.25-15 MG/5ML syrup Take 5 mLs by mouth 4 (four) times daily as needed. 01/05/18   Achsah Mcquade, PA-C    Family History Family History  Problem Relation Age of Onset  . Heart attack Father   . Heart disease Father   . Hypertension Sister   . Hypertension Mother   . Diabetes Mother     Social History Social History   Tobacco Use  . Smoking status: Never Smoker  . Smokeless tobacco: Never Used  Substance Use Topics  . Alcohol use: Yes    Comment: occassional  . Drug use: No     Allergies   Penicillins   Review of Systems Review of Systems  Constitutional: Positive for fever.  HENT: Positive for congestion, rhinorrhea, sinus pressure and sore throat.   Respiratory: Positive for cough.   Musculoskeletal: Positive for myalgias.  Neurological: Positive for headaches.  All other systems reviewed and are negative.  Physical Exam Updated Vital Signs BP (!) 153/89 (BP Location: Right Arm)   Pulse 72   Temp 97.9 F (36.6 C) (Oral)   Resp 16   SpO2 99%   Physical Exam  Constitutional: He is oriented to person, place, and time. He appears well-developed and well-nourished. No distress.  HENT:  Head: Normocephalic and atraumatic.  Right Ear: Tympanic membrane, external ear and ear canal normal.  Left Ear: Tympanic membrane, external ear and ear canal normal.  Nose: Mucosal edema present. Right sinus  exhibits frontal sinus tenderness. Right sinus exhibits no maxillary sinus tenderness. Left sinus exhibits frontal sinus tenderness. Left sinus exhibits no maxillary sinus tenderness.  Mouth/Throat: Uvula is midline and mucous membranes are normal. Posterior oropharyngeal erythema present. No oropharyngeal exudate, posterior oropharyngeal edema or tonsillar abscesses. No tonsillar exudate.  Nasal mucosal edema.  OP mildly erythematous without exudate.  Uvula midline with equal palate rise.  TMs nonerythematous and not bulging bilaterally.  Mild tenderness palpation of frontal sinuses.  Eyes: Conjunctivae and EOM are normal. Pupils are equal, round, and reactive to light.  Neck: Normal range of motion.  Cardiovascular: Normal rate, regular rhythm and intact distal pulses.  Pulmonary/Chest: Effort normal and breath sounds normal. He has no decreased breath sounds. He has no wheezes. He has no rhonchi. He has no rales.  Pt speaking in full sentences without difficulty.  Clear lung sounds in all fields  Abdominal: Soft. He exhibits no distension. There is no tenderness.  Musculoskeletal: Normal range of motion.  Lymphadenopathy:    He has cervical adenopathy.  Neurological: He is alert and oriented to person, place, and time.  Skin: Skin is warm.  Psychiatric: He has a normal mood and affect.  Nursing note and vitals reviewed.    ED Treatments / Results  Labs (all labs ordered are listed, but only abnormal results are displayed) Labs Reviewed - No data to display  EKG  EKG Interpretation None       Radiology Dg Chest 2 View  Result Date: 01/05/2018 CLINICAL DATA:  Cough/cong, chills, body aches, headache and SOB all x 2-3 days, nonsmoker. Hx asthma. EXAM: CHEST  2 VIEW COMPARISON:  06/14/2016 FINDINGS: The heart size and mediastinal contours are within normal limits. Both lungs are clear. No pleural effusion or pneumothorax. The visualized skeletal structures are unremarkable.  IMPRESSION: Normal chest radiographs. Electronically Signed   By: Amie Portlandavid  Ormond M.D.   On: 01/05/2018 19:26    Procedures Procedures (including critical care time)  Medications Ordered in ED Medications - No data to display   Initial Impression / Assessment and Plan / ED Course  I have reviewed the triage vital signs and the nursing notes.  Pertinent labs & imaging results that were available during my care of the patient were reviewed by me and considered in my medical decision making (see chart for details).     Patient presenting with 2 day h/o URI symptoms.  Physical exam reassuring, patient is afebrile and appears nontoxic.  Pulmonary exam reassuring.  Doubt pneumonia, strep, other bacterial infection, or peritonsillar abscess.CXr viewed and interpreted by me, no sign of PNA. Likely viral URI, possibly flu.  However, outside tx window for tamiflu. Will treat symptomatically.  Patient to follow-up with primary care as needed.  At this time, patient appears safe for discharge.  Return precautions given.  Patient states he understands and agrees to plan.   Final Clinical Impressions(s) / ED Diagnoses   Final diagnoses:  Flu-like symptoms  ED Discharge Orders        Ordered    fluticasone (FLONASE) 50 MCG/ACT nasal spray  Daily     01/05/18 2120    benzonatate (TESSALON) 100 MG capsule  Every 8 hours     01/05/18 2120    promethazine-dextromethorphan (PROMETHAZINE-DM) 6.25-15 MG/5ML syrup  4 times daily PRN     01/05/18 2120       Alveria Apley, PA-C 01/06/18 0102    Maia Plan, MD 01/06/18 1003

## 2018-02-06 ENCOUNTER — Encounter (HOSPITAL_COMMUNITY): Payer: Self-pay

## 2018-02-06 ENCOUNTER — Other Ambulatory Visit: Payer: Self-pay

## 2018-02-06 ENCOUNTER — Emergency Department (HOSPITAL_COMMUNITY): Payer: Managed Care, Other (non HMO)

## 2018-02-06 DIAGNOSIS — M79641 Pain in right hand: Secondary | ICD-10-CM | POA: Diagnosis not present

## 2018-02-06 DIAGNOSIS — R200 Anesthesia of skin: Secondary | ICD-10-CM | POA: Diagnosis present

## 2018-02-06 DIAGNOSIS — Z880 Allergy status to penicillin: Secondary | ICD-10-CM

## 2018-02-06 DIAGNOSIS — I10 Essential (primary) hypertension: Secondary | ICD-10-CM | POA: Diagnosis present

## 2018-02-06 DIAGNOSIS — J45909 Unspecified asthma, uncomplicated: Principal | ICD-10-CM | POA: Diagnosis present

## 2018-02-06 DIAGNOSIS — Z791 Long term (current) use of non-steroidal anti-inflammatories (NSAID): Secondary | ICD-10-CM

## 2018-02-06 DIAGNOSIS — R402413 Glasgow coma scale score 13-15, at hospital admission: Secondary | ICD-10-CM | POA: Diagnosis present

## 2018-02-06 LAB — BASIC METABOLIC PANEL
ANION GAP: 8 (ref 5–15)
BUN: 14 mg/dL (ref 6–20)
CALCIUM: 9.1 mg/dL (ref 8.9–10.3)
CO2: 25 mmol/L (ref 22–32)
Chloride: 105 mmol/L (ref 101–111)
Creatinine, Ser: 0.93 mg/dL (ref 0.61–1.24)
GFR calc Af Amer: >60 mL/min (ref >60)
GFR calc non Af Amer: >60 mL/min (ref >60)
GLUCOSE: 106 mg/dL — AB (ref 65–99)
Potassium: 3.9 mmol/L (ref 3.5–5.1)
Sodium: 138 mmol/L (ref 135–145)

## 2018-02-06 LAB — CBC WITH DIFFERENTIAL/PLATELET
BASOS PCT: 0 %
Basophils Absolute: 0 10*3/uL (ref 0.0–0.1)
Eosinophils Absolute: 0.1 10*3/uL (ref 0.0–0.7)
Eosinophils Relative: 2 %
HEMATOCRIT: 40.8 % (ref 39.0–52.0)
Hemoglobin: 13.4 g/dL (ref 13.0–17.0)
LYMPHS PCT: 31 %
Lymphs Abs: 1.2 10*3/uL (ref 0.7–4.0)
MCH: 28.6 pg (ref 26.0–34.0)
MCHC: 32.8 g/dL (ref 30.0–36.0)
MCV: 87.2 fL (ref 78.0–100.0)
MONO ABS: 0.3 10*3/uL (ref 0.1–1.0)
MONOS PCT: 7 %
NEUTROS ABS: 2.4 10*3/uL (ref 1.7–7.7)
Neutrophils Relative %: 60 %
Platelets: 297 10*3/uL (ref 150–400)
RBC: 4.68 MIL/uL (ref 4.22–5.81)
RDW: 13.5 % (ref 11.5–15.5)
WBC: 4 10*3/uL (ref 4.0–10.5)

## 2018-02-06 MED ORDER — FENTANYL CITRATE (PF) 100 MCG/2ML IJ SOLN
50.0000 ug | Freq: Once | INTRAMUSCULAR | Status: AC
  Administered 2018-02-06: 50 ug via INTRAMUSCULAR
  Filled 2018-02-06: qty 2

## 2018-02-06 MED ORDER — IOPAMIDOL (ISOVUE-370) INJECTION 76%
100.0000 mL | Freq: Once | INTRAVENOUS | Status: AC | PRN
  Administered 2018-02-06: 100 mL via INTRAVENOUS

## 2018-02-06 NOTE — ED Triage Notes (Signed)
Pt reports right hand numbness/tingling. Pt states it has been going on for a couple of weeks but got worse today. Some discoloration noted in the finger tips. Pt states the tingling is worse in his middle, ring, and little finger. Pt reports he does heavy lifting at work.

## 2018-02-06 NOTE — ED Provider Notes (Signed)
Patient placed in Quick Look pathway, seen and evaluated   Chief Complaint: Right hand numbness   HPI:   32 y.o. M who presents for evaluation of right hand pain and numbness that is been ongoing for several weeks but worsened today.  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   ROS: Hand numbness.  (one)  Physical Exam:   Gen: No distress  Neuro: Awake and Alert  Skin: Warm    Focused Exam: Good radial pulse in the right side, unable to assess ulnar pulse.  Digits 3,4, 5 have delayed cap refill are dusky in appearance and cool to touch.  Concern for vascular etiology.  We will plan to get basic labs, CTA of upper extremity.  Radiologist called me with results of the CTA of the right upper extremity.  Ulnar artery hard to visualize from CTA.  Recommends calling vascular to see if any further imaging is needed.  I discussed with Dr. Gilmer Morane (vascular).  Given that patient has a palpable pulse, does not feel like this is a vascular issue does not recommend further imaging at this time.  Reevaluation of patient.  Discoloration of the distal fingertips has improved but patient still with some delayed cap refill noted to digits 3, 4, 5.  Bedside Doppler showed good ulnar pulse, good pulmonary artery pulses throughout all 5 digits.  No indication of acute arterial embolism at this time.  Initiation of care has begun. The patient has been counseled on the process, plan, and necessity for staying for the completion/evaluation, and the remainder of the medical screening examination    Rosana HoesLayden, Lindsey A, PA-C 02/07/18 0059    Mancel BaleWentz, Elliott, MD 02/09/18 1731

## 2018-02-07 ENCOUNTER — Encounter (HOSPITAL_COMMUNITY): Payer: Self-pay | Admitting: Interventional Radiology

## 2018-02-07 ENCOUNTER — Inpatient Hospital Stay (HOSPITAL_COMMUNITY)
Admission: EM | Admit: 2018-02-07 | Discharge: 2018-02-07 | DRG: 203 | Disposition: A | Discharge status: Other Acute Inpatient Hospital | Payer: Managed Care, Other (non HMO) | Attending: Family Medicine | Admitting: Family Medicine

## 2018-02-07 ENCOUNTER — Emergency Department (HOSPITAL_COMMUNITY): Payer: Managed Care, Other (non HMO)

## 2018-02-07 DIAGNOSIS — M79641 Pain in right hand: Secondary | ICD-10-CM

## 2018-02-07 DIAGNOSIS — I10 Essential (primary) hypertension: Secondary | ICD-10-CM

## 2018-02-07 DIAGNOSIS — M79643 Pain in unspecified hand: Secondary | ICD-10-CM | POA: Diagnosis present

## 2018-02-07 DIAGNOSIS — J45902 Unspecified asthma with status asthmaticus: Secondary | ICD-10-CM | POA: Diagnosis not present

## 2018-02-07 DIAGNOSIS — J45909 Unspecified asthma, uncomplicated: Secondary | ICD-10-CM | POA: Diagnosis present

## 2018-02-07 DIAGNOSIS — Z791 Long term (current) use of non-steroidal anti-inflammatories (NSAID): Secondary | ICD-10-CM | POA: Diagnosis not present

## 2018-02-07 DIAGNOSIS — Z880 Allergy status to penicillin: Secondary | ICD-10-CM | POA: Diagnosis not present

## 2018-02-07 DIAGNOSIS — R200 Anesthesia of skin: Secondary | ICD-10-CM | POA: Diagnosis present

## 2018-02-07 DIAGNOSIS — R402413 Glasgow coma scale score 13-15, at hospital admission: Secondary | ICD-10-CM | POA: Diagnosis present

## 2018-02-07 HISTORY — PX: IR ANGIOGRAM EXTREMITY RIGHT: IMG652

## 2018-02-07 HISTORY — PX: IR US GUIDE VASC ACCESS RIGHT: IMG2390

## 2018-02-07 LAB — POCT ACTIVATED CLOTTING TIME: ACTIVATED CLOTTING TIME: 147 s

## 2018-02-07 LAB — HEPARIN LEVEL (UNFRACTIONATED): Heparin Unfractionated: <0.1 IU/mL — ABNORMAL LOW (ref 0.30–0.70)

## 2018-02-07 MED ORDER — NIFEDIPINE ER 30 MG PO TB24
30.0000 mg | ORAL_TABLET | Freq: Once | ORAL | Status: AC
  Administered 2018-02-07: 30 mg via ORAL
  Filled 2018-02-07: qty 1

## 2018-02-07 MED ORDER — FLUTICASONE PROPIONATE 50 MCG/ACT NA SUSP
2.0000 | Freq: Every day | NASAL | Status: DC
  Filled 2018-02-07: qty 16

## 2018-02-07 MED ORDER — MIDAZOLAM HCL 2 MG/2ML IJ SOLN
INTRAMUSCULAR | Status: AC
  Filled 2018-02-07: qty 4

## 2018-02-07 MED ORDER — NITROGLYCERIN 1 MG/10 ML FOR IR/CATH LAB
INTRA_ARTERIAL | Status: AC
  Filled 2018-02-07: qty 10

## 2018-02-07 MED ORDER — NITROGLYCERIN 2 % TD OINT
1.0000 [in_us] | TOPICAL_OINTMENT | Freq: Once | TRANSDERMAL | Status: AC
  Administered 2018-02-07: 1 [in_us] via TOPICAL
  Filled 2018-02-07: qty 2

## 2018-02-07 MED ORDER — ALBUTEROL SULFATE (2.5 MG/3ML) 0.083% IN NEBU: 2.5000 mg | INHALATION_SOLUTION | RESPIRATORY_TRACT | Status: DC | PRN

## 2018-02-07 MED ORDER — NITROGLYCERIN 1 MG/10 ML FOR IR/CATH LAB
INTRA_ARTERIAL | Status: AC | PRN
  Administered 2018-02-07: 100 ug via INTRA_ARTERIAL

## 2018-02-07 MED ORDER — ACETAMINOPHEN 650 MG RE SUPP: 650.0000 mg | Freq: Four times a day (QID) | RECTAL | Status: DC | PRN

## 2018-02-07 MED ORDER — SODIUM CHLORIDE 0.9 % IV SOLN
Freq: Once | INTRAVENOUS | Status: AC
  Administered 2018-02-07: 14:00:00 via INTRAVENOUS

## 2018-02-07 MED ORDER — MIDAZOLAM HCL 2 MG/2ML IJ SOLN
INTRAMUSCULAR | Status: AC | PRN
  Administered 2018-02-07: 0.5 mg via INTRAVENOUS
  Administered 2018-02-07: 1 mg via INTRAVENOUS
  Administered 2018-02-07: 0.5 mg via INTRAVENOUS

## 2018-02-07 MED ORDER — FENTANYL CITRATE (PF) 100 MCG/2ML IJ SOLN: 50.0000 ug | INTRAMUSCULAR | Status: DC | PRN

## 2018-02-07 MED ORDER — IODIXANOL 320 MG/ML IV SOLN: 76.0000 mL | Freq: Once | INTRAVENOUS | Status: DC | PRN

## 2018-02-07 MED ORDER — ACETAMINOPHEN 500 MG PO TABS
1000.0000 mg | ORAL_TABLET | Freq: Once | ORAL | Status: AC
  Administered 2018-02-07: 1000 mg via ORAL
  Filled 2018-02-07: qty 2

## 2018-02-07 MED ORDER — FENTANYL CITRATE (PF) 100 MCG/2ML IJ SOLN
INTRAMUSCULAR | Status: AC | PRN
  Administered 2018-02-07: 50 ug via INTRAVENOUS

## 2018-02-07 MED ORDER — HEPARIN BOLUS VIA INFUSION
4000.0000 [IU] | Freq: Once | INTRAVENOUS | Status: AC
  Administered 2018-02-07: 4000 [IU] via INTRAVENOUS
  Filled 2018-02-07: qty 4000

## 2018-02-07 MED ORDER — ONDANSETRON HCL 4 MG PO TABS: 4.0000 mg | ORAL_TABLET | Freq: Four times a day (QID) | ORAL | Status: DC | PRN

## 2018-02-07 MED ORDER — ACETAMINOPHEN 325 MG PO TABS: 650.0000 mg | ORAL_TABLET | Freq: Four times a day (QID) | ORAL | Status: DC | PRN

## 2018-02-07 MED ORDER — BUPIVACAINE HCL (PF) 0.5 % IJ SOLN
10.0000 mL | Freq: Once | INTRAMUSCULAR | Status: AC
  Administered 2018-02-07: 10 mL
  Filled 2018-02-07: qty 10

## 2018-02-07 MED ORDER — FENTANYL CITRATE (PF) 100 MCG/2ML IJ SOLN
INTRAMUSCULAR | Status: AC
  Filled 2018-02-07: qty 4

## 2018-02-07 MED ORDER — HEPARIN (PORCINE) IN NACL 100-0.45 UNIT/ML-% IJ SOLN
1350.0000 [IU]/h | INTRAMUSCULAR | Status: DC
  Administered 2018-02-07: 1350 [IU]/h via INTRAVENOUS
  Filled 2018-02-07 (×2): qty 250

## 2018-02-07 MED ORDER — NIFEDIPINE ER 30 MG PO TB24: 30.0000 mg | ORAL_TABLET | Freq: Every day | ORAL | Status: DC

## 2018-02-07 MED ORDER — SODIUM CHLORIDE 0.9 % IV BOLUS
1000.0000 mL | Freq: Once | INTRAVENOUS | Status: AC
  Administered 2018-02-07: 1000 mL via INTRAVENOUS

## 2018-02-07 MED ORDER — LIDOCAINE HCL 1 % IJ SOLN
INTRAMUSCULAR | Status: AC
  Filled 2018-02-07: qty 20

## 2018-02-07 MED ORDER — LIDOCAINE HCL 1 % IJ SOLN
INTRAMUSCULAR | Status: AC | PRN
  Administered 2018-02-07: 5 mL

## 2018-02-07 MED ORDER — NITROGLYCERIN 2 % TD OINT: 3.0000 [in_us] | TOPICAL_OINTMENT | Freq: Four times a day (QID) | TRANSDERMAL | Status: DC

## 2018-02-07 MED ORDER — BUPIVACAINE HCL 0.5 % IJ SOLN
10.0000 mL | Freq: Once | INTRAMUSCULAR | Status: DC
  Filled 2018-02-07: qty 10

## 2018-02-07 MED ORDER — ONDANSETRON HCL 4 MG/2ML IJ SOLN: 4.0000 mg | Freq: Four times a day (QID) | INTRAMUSCULAR | Status: DC | PRN

## 2018-02-07 NOTE — Sedation Documentation (Signed)
Patient is resting comfortably. 

## 2018-02-07 NOTE — Consult Note (Signed)
Hospital Consult    Reason for Consult:  Right hand pain Referring Physician:  Nicanor Alcon MRN #:  [id number]  History of Present Illness: This is a 32 y.o. male without significant medical history presents with what appears to be a 3-week history of right 3 through 5 digit numbness and tingling.  He now has discoloration of the distal aspects of the fingers for which he presented to the ED.  He has had administration of nitroglycerin ointment as well as nifedipine with some resolution of his pain.  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  Vascular consulted for further evaluation.  Past Medical History:  Diagnosis Date  . Asthma   . Hypertension     Past Surgical History:  Procedure Laterality Date  . MOUTH SURGERY      Allergies  Allergen Reactions  . Penicillins Nausea And Vomiting    Has patient had a PCN reaction causing immediate rash, facial/tongue/throat swelling, SOB or lightheadedness with hypotension:YES Has patient had a PCN reaction causing severe rash involving mucus membranes or skin necrosis: NO Has patient had a PCN reaction that required hospitalization NO Has patient had a PCN reaction occurring within the last 10 years: NO If all of the above answers are "NO", then may proceed with Cephalosporin use.    Prior to Admission medications   Medication Sig Start Date End Date Taking? Authorizing Provider  benzonatate (TESSALON) 100 MG capsule Take 1 capsule (100 mg total) by mouth every 8 (eight) hours. 01/05/18   Caccavale, Sophia, PA-C  butalbital-acetaminophen-caffeine (FIORICET, ESGIC) [phone] MG tablet Take 1-2 tablets by mouth every 8 (eight) hours as needed for headache. 06/30/17   Antony Madura, PA-C  cyclobenzaprine (FLEXERIL) 10 MG tablet Take 1 tablet (10 mg total) by mouth 2 (two) times daily as needed for muscle spasms. 10/27/17   Fayrene Helper, PA-C  fluticasone (FLONASE) 50 MCG/ACT nasal spray Place 2 sprays into  both nostrils daily. 01/05/18   Caccavale, Sophia, PA-C  ibuprofen (ADVIL,MOTRIN) 600 MG tablet Take 1 tablet (600 mg total) by mouth every 8 (eight) hours as needed for mild pain or moderate pain. 10/27/17   Fayrene Helper, PA-C  naproxen (NAPROSYN) 500 MG tablet Take 1 tablet (500 mg total) by mouth 2 (two) times daily. 11/04/17   Dione Booze, MD  promethazine-dextromethorphan (PROMETHAZINE-DM) 6.25-15 MG/5ML syrup Take 5 mLs by mouth 4 (four) times daily as needed. 01/05/18   Alveria Apley, PA-C    Social History   Socioeconomic History  . Marital status: Single    Spouse name: Not on file  . Number of children: Not on file  . Years of education: Not on file  . Highest education level: Not on file  Occupational History  . Not on file  Social Needs  . Financial resource strain: Not on file  . Food insecurity:    Worry: Not on file    Inability: Not on file  . Transportation needs:    Medical: Not on file    Non-medical: Not on file  Tobacco Use  . Smoking status: Never Smoker  . Smokeless tobacco: Never Used  Substance and Sexual Activity  . Alcohol use: Yes    Comment: occassional  . Drug use: No  . Sexual activity: Not on file  Lifestyle  . Physical activity:    Days per week: Not on file    Minutes per session: Not on file  . Stress: Not on  file  Relationships  . Social connections:    Talks on phone: Not on file    Gets together: Not on file    Attends religious service: Not on file    Active member of club or organization: Not on file    Attends meetings of clubs or organizations: Not on file    Relationship status: Not on file  . Intimate partner violence:    Fear of current or ex partner: Not on file    Emotionally abused: Not on file    Physically abused: Not on file    Forced sexual activity: Not on file  Other Topics Concern  . Not on file  Social History Narrative  . Not on file     Family History  Problem Relation Age of Onset  . Heart attack  Father   . Heart disease Father   . Hypertension Sister   . Hypertension Mother   . Diabetes Mother     ROS:   Cardiovascular: []  chest pain/pressure []  palpitations []  SOB lying flat []  DOE []  pain in legs while walking []  pain in legs at rest []  pain in legs at night []  non-healing ulcers []  hx of DVT []  swelling in legs  Pulmonary: []  productive cough []  asthma/wheezing []  home O2  Neurologic: []  weakness in []  arms []  legs [x]  numbness in hands []  hx of CVA []  mini stroke [] difficulty speaking or slurred speech []  temporary loss of vision in one eye []  dizziness  Hematologic: []  hx of cancer []  bleeding problems []  problems with blood clotting easily  Endocrine:   []  diabetes []  thyroid disease  GI []  vomiting blood []  blood in stool  GU: []  CKD/renal failure []  HD--[]  M/W/F or []  T/T/S []  burning with urination []  blood in urine  Psychiatric: []  anxiety []  depression  Musculoskeletal: []  arthritis []  joint pain  Integumentary: []  rashes []  ulcers  Constitutional: []  fever []  chills   Physical Examination  Vitals:   02/07/18 0345 02/07/18 0630  BP: (!) 137/93 130/77  Pulse: (!) 48 (!) 45  Resp: 15 15  Temp:    SpO2: 100% 97%   Body mass index is 30.68 kg/m.  General: NAD HENT: WNL, normocephalic Pulmonary: normal non-labored breathing Cardiac: strong palpable bilateral radial pulses Ulnar signal is present but diminished with compression of radial artery There is a strong palmar arch that also diminishes with compression of the radial artery Proximal digital arteries have strong signals Abdomen: soft, NT/ND, no masses Extremities: ischemic appearing right fingers 3-5 distally Neurologic: strong right hand grip, states fingers 3-5 are numb at distal phalynx Psychiatric:  Appropriate mood and affect  CBC    Component Value Date/Time   WBC 4.0 02/06/2018 1743   RBC 4.68 02/06/2018 1743   HGB 13.4 02/06/2018 1743   HCT  40.8 02/06/2018 1743   PLT 297 02/06/2018 1743   MCV 87.2 02/06/2018 1743   MCV 87.0 10/13/2014 2012   MCH 28.6 02/06/2018 1743   MCHC 32.8 02/06/2018 1743   RDW 13.5 02/06/2018 1743   LYMPHSABS 1.2 02/06/2018 1743   MONOABS 0.3 02/06/2018 1743   EOSABS 0.1 02/06/2018 1743   BASOSABS 0.0 02/06/2018 1743    BMET    Component Value Date/Time   NA 138 02/06/2018 1743   K 3.9 02/06/2018 1743   CL 105 02/06/2018 1743   CO2 25 02/06/2018 1743   GLUCOSE 106 (H) 02/06/2018 1743   BUN 14 02/06/2018 1743   CREATININE 0.93  02/06/2018 1743   CREATININE 1.08 10/13/2014 1958   CALCIUM 9.1 02/06/2018 1743   GFRNONAA >60 02/06/2018 1743   GFRNONAA >89 10/13/2014 1958   GFRAA >60 02/06/2018 1743   GFRAA >89 10/13/2014 1958    COAGS: No results found for: INR, PROTIME   Non-Invasive Vascular Imaging:   CTA IMPRESSION: 1. No significant proximal stenosis, occlusion, or vasculitis in the hand upper extremity to the trifurcation. 2. The distal ulnar artery is not well visualized beyond the mid forearm, which may be a normal variant. 3. The radial artery is visualized to the wrist. 4. Vascular structures of the hand are not well visualized. This could be secondary to vasospasm associated with cold temperature injury. The vessels of the hand are not typically well seen with CTA.   ASSESSMENT/PLAN: This is a 32 y.o. male with what appears to be ischemic appearing right digits 3 through 5 the distal phalanx only.  He has a very strong multiphasic arch that diminishes with compression of his radial artery and has concern for ulnar artery occlusion on CT scan and this is also likely from physical exam.  Given the patency of his radial artery strong flow into his proximal fingers I do not think any endovascular intervention is warranted at this time as he seems to have adequate collateral flow from his radial artery.  Brandon C. Randie Heinz, MD [facility] [place] Office:  [phone] Pager: [phone]

## 2018-02-07 NOTE — ED Notes (Signed)
IR arrived in holding area to transport patient to IR for procedure.

## 2018-02-07 NOTE — H&P (Signed)
[facility] History and Physical  Rodney Nunez WUJ:[id number] DOB: 01/28/1986 DOA: 02/07/2018  Referring physician:  PCP: Temple PaciniBonsu, Osei A, DO   Chief Complaint: "My hand got like this because of the freezer."  HPI: Rodney Nunez is a 32 y.o. male with past medical history of asthma and hypertension presents the emergency room with chief complaint of hand pain.  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  States that he normally has hand pain when he leaves the freezer.  States that the day prior to admission he went to work and had sudden onset of severe pain.  Came to emergency room to get his hand evaluated.  Note: Denies any history of ray nods or vasculitis.  No family history of autoimmune disease.  ED Course: Hand surgery consulted and stated patient need outpatient follow-up.  EDP then [facility] can [facility] the patient if IR evaluation could be done.  IR year admits to hand.  No occlusion seen.  [facility].  Hospitalist consulted for admission while patient awaits bed.  Review of Systems:  As per HPI otherwise 10 point review of systems negative.    Past Medical History:  Diagnosis Date  . Asthma   . Hypertension    Past Surgical History:  Procedure Laterality Date  . IR ANGIOGRAM EXTREMITY RIGHT  02/07/2018  . IR US GUIDE VASC ACCESS RIGHT  02/07/2018  . MOUTH SURGERY     Social History:  reports that he has never smoked. He has never used smokeless tobacco. He reports that he drinks alcohol. He reports that he does not use drugs.  Allergies  Allergen Reactions  . Penicillins Nausea And Vomiting    Has patient had a PCN reaction causing immediate rash, facial/tongue/throat swelling, SOB or lightheadedness with hypotension:YES Has patient had a PCN reaction causing severe rash involving mucus membranes or skin necrosis: NO Has patient had a  PCN reaction that required hospitalization NO Has patient had a PCN reaction occurring within the last 10 years: NO If all of the above answers are "NO", then may proceed with Cephalosporin use.    Family History  Problem Relation Age of Onset  . Heart attack Father   . Heart disease Father   . Hypertension Sister   . Hypertension Mother   . Diabetes Mother      Prior to Admission medications   Medication Sig Start Date End Date Taking? Authorizing Provider  fluticasone (FLONASE) 50 MCG/ACT nasal spray Place 2 sprays into both nostrils daily. 01/05/18  Yes Caccavale, Sophia, PA-C  naproxen (NAPROSYN) 500 MG tablet Take 1 tablet (500 mg total) by mouth 2 (two) times daily. 11/04/17  Yes Dione BoozeGlick, David, MD  promethazine-dextromethorphan (PROMETHAZINE-DM) 6.25-15 MG/5ML syrup Take 5 mLs by mouth 4 (four) times daily as needed. 01/05/18  Yes Caccavale, Sophia, PA-C  benzonatate (TESSALON) 100 MG capsule Take 1 capsule (100 mg total) by mouth every 8 (eight) hours. Patient not taking: Reported on 02/07/2018 01/05/18   Caccavale, Sophia, PA-C  butalbital-acetaminophen-caffeine (FIORICET, ESGIC) 50-325-40 MG tablet Take 1-2 tablets by mouth every 8 (eight) hours as needed for headache. Patient not taking: Reported on 02/07/2018 06/30/17   Antony MaduraHumes, Kelly, PA-C  cyclobenzaprine (FLEXERIL) 10 MG tablet Take 1 tablet (10 mg total) by mouth 2 (two) times daily as needed for muscle spasms. Patient not taking: Reported on 02/07/2018 10/27/17  Fayrene Helper, PA-C   Physical Exam: Vitals:   02/07/18 1430 02/07/18 1545 02/07/18 1600 02/07/18 1615  BP: (!) 137/95 130/85 132/79 138/62  Pulse: (!) 47 62 69 78  Resp: 15 18 17 20   Temp:      TempSrc:      SpO2: 100% 96% 98% 96%  Weight:      Height:        Wt Readings from Last 3 Encounters:  02/07/18 99.8 kg (220 lb)  11/04/17 99.8 kg (220 lb)  10/27/17 99.8 kg (220 lb)    General:  Appears calm and comfortable; A&Ox3 Eyes:  PERRL, EOMI, normal lids,  iris ENT:  grossly normal hearing, lips & tongue Neck:  no LAD, masses or thyromegaly Cardiovascular:  RRR, no m/r/g. No LE edema.  Respiratory:  CTA bilaterally, no w/r/r. Normal respiratory effort. Abdomen:  soft, ntnd Skin:  no rash or induration seen on limited exam Musculoskeletal:  grossly normal tone BUE/BLE Psychiatric:  grossly normal mood and affect, speech fluent and appropriate Neurologic:  CN 2-12 grossly intact, moves all extremities in coordinated fashion. Decr sensation in 3,4,5 on R             Labs on Admission:  Basic Metabolic Panel: Recent Labs  Lab 02/06/18 1743  NA 138  K 3.9  CL 105  CO2 25  GLUCOSE 106*  BUN 14  CREATININE 0.93  CALCIUM 9.1   Liver Function Tests: No results for input(s): AST, ALT, ALKPHOS, BILITOT, PROT, ALBUMIN in the last 168 hours. No results for input(s): LIPASE, AMYLASE in the last 168 hours. No results for input(s): AMMONIA in the last 168 hours. CBC: Recent Labs  Lab 02/06/18 1743  WBC 4.0  NEUTROABS 2.4  HGB 13.4  HCT 40.8  MCV 87.2  PLT 297   Cardiac Enzymes: No results for input(s): CKTOTAL, CKMB, CKMBINDEX, TROPONINI in the last 168 hours.  BNP (last 3 results) No results for input(s): BNP in the last 8760 hours.  ProBNP (last 3 results) No results for input(s): PROBNP in the last 8760 hours.   Serum creatinine: 0.93 mg/dL 16/10/96 0454 Estimated creatinine clearance: 138.5 mL/min  CBG: No results for input(s): GLUCAP in the last 168 hours.  Radiological Exams on Admission: Ct Angio Up Extrem Right W &/or Wo Contrast  Result Date: 02/06/2018 CLINICAL DATA:  Tingling and numbness in the right hand for 2 weeks with progression today. Paresthesias. Discoloration of the finger tips. EXAM: CT ANGIOGRAPHY OF THE right upperEXTREMITY TECHNIQUE: Multidetector CT imaging of the right upperwas performed using the standard protocol during bolus administration of intravenous contrast. Multiplanar CT image  reconstructions and MIPs were obtained to evaluate the vascular anatomy. CONTRAST:  ISOVUE-370 IOPAMIDOL (ISOVUE-370) INJECTION 76% COMPARISON:  None. FINDINGS: The subclavian and axillary arteries normal. Proximal circumflex arteries are within normal limits. The bifurcation is unremarkable. The ulnar artery is main diminutive in the forearm. The radial artery can be followed to the wrist. Arteries are not well defined within the hand. There is no evidence for proximal vasculitis the stenosis, or occlusion. The aortic bifurcation iliac arteries are visualized. Proximal femoral arteries are within normal limits. Osseous structures of the extremity are within normal limits. Visualized right hemithorax is clear. Visualized abdominal structures are within limits. Additionally, there is some streak artifact across the forearm and hand. Review of the MIP images confirms the above findings. IMPRESSION: 1. No significant proximal stenosis, occlusion, or vasculitis in the hand upper extremity to the trifurcation. 2.  The distal ulnar artery is not well visualized beyond the mid forearm, which may be a normal variant. 3. The radial artery is visualized to the wrist. 4. Vascular structures of the hand are not well visualized. This could be secondary to vasospasm associated with cold temperature injury. The vessels of the hand are not typically well seen with CTA. These results were called by telephone at the time of interpretation on 02/06/2018 at 8:00 pm to [facility] , who verbally acknowledged these results. Electronically Signed   By: Marin Roberts M.D.   On: 02/06/2018 20:01   Ir Angiogram Extremity Right  Result Date: 02/07/2018 INDICATION: Ischemic right fingers EXAM: RIGHT UPPER EXTREMITY ANGIOGRAM.  ULTRASOUND GUIDANCE. MEDICATIONS: None ANESTHESIA/SEDATION: Moderate (conscious) sedation was employed during this procedure. A total of Versed 2 mg and Fentanyl 50 mcg was administered intravenously.  Moderate Sedation Time: 75 minutes. The patient's level of consciousness and vital signs were monitored continuously by radiology nursing throughout the procedure under my direct supervision. CONTRAST:  76 cc Visipaque 320 FLUOROSCOPY TIME:  Fluoroscopy Time: 4 minutes 48 seconds (111 mGy). COMPLICATIONS: None PROCEDURE: Informed consent was obtained from the patient following explanation of the procedure, risks, benefits and alternatives. The patient understands, agrees and consents for the procedure. All questions were addressed. A time out was performed prior to the initiation of the procedure. Maximal barrier sterile technique utilized including caps, mask, sterile gowns, sterile gloves, large sterile drape, hand hygiene, and Betadine prep. The right groin was prepped and draped in a sterile fashion. 1% lidocaine was utilized for local anesthesia. Under sonographic guidance, a micropuncture needle was inserted into the right common femoral artery and removed over a 018 wire which was up sized to a Bentson. A 5 French sheath was inserted. A JB 1 catheter was advanced over the Bentson wire to the aortic arch. A CT angiogram of the right upper extremity was previously performed demonstrating wide patency of the aortic arch, innominate artery, right subclavian artery, and right axillary artery. The catheter was positioned in the innominate artery and a innominate artery arteriogram was performed delineating the origin of the right subclavian artery. The catheter was then advanced into the right brachial artery and a right upper extremity runoff was performed. Imaging of the hand was performed before and after 100 mcg intra-arterial nitroglycerin. The catheter was removed. ExoSeal was deployed after sheath removal for hemostasis. Findings FINDINGS: The innominate artery, right subclavian artery, right brachial artery, right axillary artery, right radial artery, right ulnar artery, and interosseous artery are all  widely patent. Imaging of the hand demonstrates patency of the deep and superficial arches. There is patency of the common digital arteries. Within the thumb and index finger, there is wide patency of the proper digital arteries with adequate filling of the tufts. Regarding the long finger, ring finger, and small finger, the proper digital arteries fill to the distal phalanx, then there is very poor filling beyond the DIP joint. There is very poor filling of the tufts in these 3 digits. There is no focal intraluminal filling defect. There is no abrupt cut off of a vessel. There is also no multifocal areas of vessel narrowing to suggest vasculitis. After injection of 100 mcg intra arterial nitroglycerin, there was some improvement of the runoff into the tufts of the long finger, ring finger, and small finger. There is still diminished filling of the tops of these 3 digits. IMPRESSION: Right upper extremity angiography demonstrates no focal occlusion to the level of  the wrist from the aortic arch. There is no filling defect to suggest thrombus. Specifically, the ulnar artery is widely patent. Within the hand, the arches and common digital arteries are widely patent. Vasculature to the index finger and thumb is normal. There is diminished filling of the tufts involving the long finger, ring finger, and small fingers. The proper digital arteries fill to the DIP joint of these 3 digits. After injecting intra arterial nitroglycerin, there is improved runoff and filling in the tufts but there was still diminished filling. These findings suggest a vaso constrictive process in the distal phalanges of the long finger, ring finger, and small finger. Electronically Signed   By: Jolaine Click M.D.   On: 02/07/2018 16:26   Ir US Guide Vasc Access Right  Result Date: 02/07/2018 INDICATION: Ischemic right fingers EXAM: RIGHT UPPER EXTREMITY ANGIOGRAM.  ULTRASOUND GUIDANCE. MEDICATIONS: None ANESTHESIA/SEDATION: Moderate  (conscious) sedation was employed during this procedure. A total of Versed 2 mg and Fentanyl 50 mcg was administered intravenously. Moderate Sedation Time: 75 minutes. The patient's level of consciousness and vital signs were monitored continuously by radiology nursing throughout the procedure under my direct supervision. CONTRAST:  76 cc Visipaque 320 FLUOROSCOPY TIME:  Fluoroscopy Time: 4 minutes 48 seconds (111 mGy). COMPLICATIONS: None PROCEDURE: Informed consent was obtained from the patient following explanation of the procedure, risks, benefits and alternatives. The patient understands, agrees and consents for the procedure. All questions were addressed. A time out was performed prior to the initiation of the procedure. Maximal barrier sterile technique utilized including caps, mask, sterile gowns, sterile gloves, large sterile drape, hand hygiene, and Betadine prep. The right groin was prepped and draped in a sterile fashion. 1% lidocaine was utilized for local anesthesia. Under sonographic guidance, a micropuncture needle was inserted into the right common femoral artery and removed over a 018 wire which was up sized to a Bentson. A 5 French sheath was inserted. A JB 1 catheter was advanced over the Bentson wire to the aortic arch. A CT angiogram of the right upper extremity was previously performed demonstrating wide patency of the aortic arch, innominate artery, right subclavian artery, and right axillary artery. The catheter was positioned in the innominate artery and a innominate artery arteriogram was performed delineating the origin of the right subclavian artery. The catheter was then advanced into the right brachial artery and a right upper extremity runoff was performed. Imaging of the hand was performed before and after 100 mcg intra-arterial nitroglycerin. The catheter was removed. ExoSeal was deployed after sheath removal for hemostasis. Findings FINDINGS: The innominate artery, right subclavian  artery, right brachial artery, right axillary artery, right radial artery, right ulnar artery, and interosseous artery are all widely patent. Imaging of the hand demonstrates patency of the deep and superficial arches. There is patency of the common digital arteries. Within the thumb and index finger, there is wide patency of the proper digital arteries with adequate filling of the tufts. Regarding the long finger, ring finger, and small finger, the proper digital arteries fill to the distal phalanx, then there is very poor filling beyond the DIP joint. There is very poor filling of the tufts in these 3 digits. There is no focal intraluminal filling defect. There is no abrupt cut off of a vessel. There is also no multifocal areas of vessel narrowing to suggest vasculitis. After injection of 100 mcg intra arterial nitroglycerin, there was some improvement of the runoff into the tufts of the long finger, ring finger,  and small finger. There is still diminished filling of the tops of these 3 digits. IMPRESSION: Right upper extremity angiography demonstrates no focal occlusion to the level of the wrist from the aortic arch. There is no filling defect to suggest thrombus. Specifically, the ulnar artery is widely patent. Within the hand, the arches and common digital arteries are widely patent. Vasculature to the index finger and thumb is normal. There is diminished filling of the tufts involving the long finger, ring finger, and small fingers. The proper digital arteries fill to the DIP joint of these 3 digits. After injecting intra arterial nitroglycerin, there is improved runoff and filling in the tufts but there was still diminished filling. These findings suggest a vaso constrictive process in the distal phalanges of the long finger, ring finger, and small finger. Electronically Signed   By: Jolaine Click M.D.   On: 02/07/2018 16:26    EKG: pending  Assessment/Plan Active Problems:   Hand pain  Hand  pain Cont heparin Neuro/vasc checks Vasc surgery consulted by EDP; paged in afternoon IVF Given nifedipine ANA with reflex ordered WF accepted pt to be seen by hand surgeon Spoke to Dr. Ileene Hutchinson (fellow) with hand surgery at [facility], she felt that based on the results from IR imaging there is nothing that has very she could intervene nonsurgically.  Advised medical management.  Patient to be worked up for microvascular disease  HTN Cont nifedipine  Allergies Cont flonase  Ashtma Prn albuterol  Code Status: FC  DVT Prophylaxis: heparin Family Communication: mop in room Disposition Plan: Pending Improvement  Status: tele inpt  Haydee Salter, MD Family Medicine [facility] [email or web address] Password [email or web address]

## 2018-02-07 NOTE — ED Notes (Signed)
Patient continue to be in IR.

## 2018-02-07 NOTE — ED Provider Notes (Addendum)
Dr. Onalee Hua12: 30 have reviewed again with Dr. Dyke MaesPostana at [facility] after completion of arteriogram.  He advises can review risk benefits with patient of attempting to alleviate symptoms with digital block and IV prostaglandin (has not used this therapy previously, does not have suggestion for efficacy or safety).  Suggest his basic plan would be to continue the patient on heparin and admit to medical service with consultation to hand.  [facility] hospital [facility].   Physical Exam  BP 122/79 (BP Location: Left Arm)   Pulse (!) 47   Temp 98 F (36.7 C) (Oral)   Resp 13   Ht 5\' 11"  (1.803 m)   Wt 99.8 kg (220 lb)   SpO2 100%   BMI 30.68 kg/m   Physical Exam Patient is alert with normal mental status.  No respiratory distress.  Hand examination as per documentation in Dr. Bishop LimboPalombo's note.  Images are present.  I have repeated images from later in the day to follow the clinical  Course. Images are just after digital block.         Images ED Course/Procedures     .Nerve Block Date/Time: 02/07/2018 3:27 PM Performed by: Arby BarrettePfeiffer, Benicia Bergevin, MD Authorized by: Arby BarrettePfeiffer, Abhinav Mayorquin, MD   Consent:    Consent obtained:  Verbal   Consent given by:  Patient   Alternatives discussed:  No treatment, alternative treatment and referral Location:    Body area:  Upper extremity   Upper extremity nerve:  Metacarpal   Laterality:  Right Pre-procedure details:    Skin preparation:  Povidone-iodine Skin anesthesia (see MAR for exact dosages):    Skin anesthesia method:  Local infiltration   Local anesthetic:  Bupivacaine 0.5% w/o epi Procedure details (see MAR for exact dosages):    Block needle gauge:  27 G   Anesthetic injected:  Bupivacaine 0.5% w/o epi   Injection procedure:  Anatomic landmarks identified, incremental injection, negative aspiration for blood, anatomic landmarks palpated and introduced needle Post-procedure details:    Dressing:  None   Outcome:   Anesthesia achieved   Patient tolerance of procedure:  Tolerated well, no immediate complications Comments:     Digital block performed on the third fourth and fifth digits.  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 Alexandria LodgePolumbo's note,  treatment was initiated as outlined.  Multiple consults were made.  Patient was seen by vascular surgery Dr. Randie Heinzain, Margaretha GlassingPA-C Jeffery for Dr. Orlan Leavensrtman (did not see patient personally, gave direction based on history and documanted exam) and interventional radiology for arteriogram and intra-arterial nitroglycerin administration without reperfusion of digitis distal to DIPs.  Ultimately, I did reconsult Dr. Dyke MaesPostana regarding completing diagnostic evaluation and making disposition for the patient that included ongoing medical care and specialty hand care.  He advised it was reasonable to try digital block with risks benefits reviewed with patient as overall a low risk procedure. He could not make positive recommendation for phosphodiesterase.   [facility] internal medicine service consulted for medical admission.  Reviewed with Dr.Hankewycz. He accepts the patient for medical admission.  Dr. Melynda RippleHobbs.  Will admit until transfer bed available.       Arby BarrettePfeiffer, Tamarcus Condie, MD 02/07/18 1546    Arby BarrettePfeiffer, Lamorris Knoblock, MD 02/07/18 1551    Arby BarrettePfeiffer, Ronalda Walpole, MD 02/07/18 [id number]
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
## 2018-02-07 NOTE — ED Notes (Signed)
Paged [facility]  Dr. Dierdre SearlesLi  per Dr. Donnald GarrePfeiffer

## 2018-02-07 NOTE — Progress Notes (Signed)
ANTICOAGULATION CONSULT NOTE - Initial Consult  Pharmacy Consult for heparin Indication: PVD  Allergies  Allergen Reactions  . Penicillins Nausea And Vomiting    Has patient had a PCN reaction causing immediate rash, facial/tongue/throat swelling, SOB or lightheadedness with hypotension:YES Has patient had a PCN reaction causing severe rash involving mucus membranes or skin necrosis: NO Has patient had a PCN reaction that required hospitalization NO Has patient had a PCN reaction occurring within the last 10 years: NO If all of the above answers are "NO", then may proceed with Cephalosporin use.    Patient Measurements: Height: 5\' 11"  (180.3 cm) Weight: 220 lb (99.8 kg) IBW/kg (Calculated) : 75.3 Heparin Dosing Weight: 95.8 kg  Vital Signs: BP: 137/93 (03/28 0345) Pulse Rate: 48 (03/28 0345)  Labs: Recent Labs    02/06/18 1743  HGB 13.4  HCT 40.8  PLT 297  CREATININE 0.93    Estimated Creatinine Clearance: 138.5 mL/min (by C-G formula based on SCr of 0.93 mg/dL).   Medical History: Past Medical History:  Diagnosis Date  . Asthma   . Hypertension     Assessment: 32 yo man to start heparin for PVD.   Goal of Therapy:  Heparin level 0.3-0.7 units/ml Monitor platelets by anticoagulation protocol: Yes   Plan:  Heparin bolus 4000 units and drip at 1350 units/hr Check heparin level 6 hours after start Daily heparin level and CBC while on heparin Monitor for bleeding complications  Camila Maita Poteet 02/07/2018,6:44 AM

## 2018-02-07 NOTE — Consult Note (Addendum)
Reason for Consult:Right 3-5 fingers hypoperfusion Referring Physician: A Palumbo  Rodney Nunez is an 32 y.o. male.  HPI: Rodney Nunez came to the ED yesterday with N/T, pain, and color change to the tips of 3-5 fingers on his right hand. He notes N/T for the last 2 weeks that got dramatically worse yesterday. He denies any prior hx/o same. 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  Past Medical History:  Diagnosis Date  . Asthma   . Hypertension     Past Surgical History:  Procedure Laterality Date  . MOUTH SURGERY      Family History  Problem Relation Age of Onset  . Heart attack Father   . Heart disease Father   . Hypertension Sister   . Hypertension Mother   . Diabetes Mother     Social History:  reports that he has never smoked. He has never used smokeless tobacco. He reports that he drinks alcohol. He reports that he does not use drugs.  Allergies:  Allergies  Allergen Reactions  . Penicillins Nausea And Vomiting    Has patient had a PCN reaction causing immediate rash, facial/tongue/throat swelling, SOB or lightheadedness with hypotension:YES Has patient had a PCN reaction causing severe rash involving mucus membranes or skin necrosis: NO Has patient had a PCN reaction that required hospitalization NO Has patient had a PCN reaction occurring within the last 10 years: NO If all of the above answers are "NO", then may proceed with Cephalosporin use.    Medications: I have reviewed the patient's current medications.  Results for orders placed or performed during the hospital encounter of 02/07/18 (from the past 48 hour(s))  Basic metabolic panel     Status: Abnormal   Collection Time: 02/06/18  5:43 PM  Result Value Ref Range   Sodium 138 135 - 145 mmol/L   Potassium 3.9 3.5 - 5.1 mmol/L   Chloride 105 101 - 111 mmol/L   CO2 25 22 - 32 mmol/L   Glucose, Bld 106 (H) 65 - 99 mg/dL   BUN 14  6 - 20 mg/dL   Creatinine, Ser 0.93 0.61 - 1.24 mg/dL   Calcium 9.1 8.9 - 10.3 mg/dL   GFR calc non Af Amer >60 >60 mL/min   GFR calc Af Amer >60 >60 mL/min    Comment: (NOTE) The eGFR has been calculated using the CKD EPI equation. This calculation has not been validated in all clinical situations. eGFR's persistently <60 mL/min signify possible Chronic Kidney Disease.    Anion gap 8 5 - 15    Comment: Performed at [facility] [address], [place], [place] [address]  CBC with Differential     Status: None   Collection Time: 02/06/18  5:43 PM  Result Value Ref Range   WBC 4.0 4.0 - 10.5 K/uL   RBC 4.68 4.22 - 5.81 MIL/uL   Hemoglobin 13.4 13.0 - 17.0 g/dL   HCT 40.8 39.0 - 52.0 %   MCV 87.2 78.0 - 100.0 fL   MCH 28.6 26.0 - 34.0 pg   MCHC 32.8 30.0 - 36.0 g/dL   RDW 13.5 11.5 - 15.5 %   Platelets 297 150 - 400 K/uL   Neutrophils Relative % 60 %   Neutro Abs 2.4 1.7 - 7.7 K/uL   Lymphocytes Relative 31 %   Lymphs Abs 1.2 0.7 - 4.0 K/uL   Monocytes Relative  7 %   Monocytes Absolute 0.3 0.1 - 1.0 K/uL   Eosinophils Relative 2 %   Eosinophils Absolute 0.1 0.0 - 0.7 K/uL   Basophils Relative 0 %   Basophils Absolute 0.0 0.0 - 0.1 K/uL    Comment: Performed at [facility] [address] [place], [place] [address]    Ct Angio Up Extrem Right W &/or Wo Contrast  Result Date: 02/06/2018 CLINICAL DATA:  Tingling and numbness in the right hand for 2 weeks with progression today. Paresthesias. Discoloration of the finger tips. EXAM: CT ANGIOGRAPHY OF THE right upperEXTREMITY TECHNIQUE: Multidetector CT imaging of the right upperwas performed using the standard protocol during bolus administration of intravenous contrast. Multiplanar CT image reconstructions and MIPs were obtained to evaluate the vascular anatomy. CONTRAST:  141m ISOVUE-370 IOPAMIDOL (ISOVUE-370) INJECTION 76% COMPARISON:  None. FINDINGS: The subclavian and axillary arteries normal. Proximal circumflex  arteries are within normal limits. The bifurcation is unremarkable. The ulnar artery is main diminutive in the forearm. The radial artery can be followed to the wrist. Arteries are not well defined within the hand. There is no evidence for proximal vasculitis the stenosis, or occlusion. The aortic bifurcation iliac arteries are visualized. Proximal femoral arteries are within normal limits. Osseous structures of the extremity are within normal limits. Visualized right hemithorax is clear. Visualized abdominal structures are within limits. Additionally, there is some streak artifact across the forearm and hand. Review of the MIP images confirms the above findings. IMPRESSION: 1. No significant proximal stenosis, occlusion, or vasculitis in the hand upper extremity to the trifurcation. 2. The distal ulnar artery is not well visualized beyond the mid forearm, which may be a normal variant. 3. The radial artery is visualized to the wrist. 4. Vascular structures of the hand are not well visualized. This could be secondary to vasospasm associated with cold temperature injury. The vessels of the hand are not typically well seen with CTA. These results were called by telephone at the time of interpretation on 02/06/2018 at 8:00 pm to [facility], who verbally acknowledged these results. Electronically Signed   By: CSan MorelleM.D.   On: 02/06/2018 20:01    Review of Systems  Constitutional: Negative for weight loss.  HENT: Negative for ear discharge, ear pain, hearing loss and tinnitus.   Eyes: Negative for blurred vision, double vision, photophobia and pain.  Respiratory: Negative for cough, sputum production and shortness of breath.   Cardiovascular: Negative for chest pain.  Gastrointestinal: Negative for abdominal pain, nausea and vomiting.  Genitourinary: Negative for dysuria, flank pain, frequency and urgency.  Musculoskeletal: Positive for joint pain (Right hand). Negative for back pain, falls,  myalgias and neck pain.  Neurological: Positive for tingling and sensory change. Negative for dizziness, focal weakness, loss of consciousness and headaches.  Endo/Heme/Allergies: Does not bruise/bleed easily.  Psychiatric/Behavioral: Negative for depression, memory loss and substance abuse. The patient is not nervous/anxious.    Blood pressure 128/76, pulse (!) 45, temperature 98 F (36.7 C), temperature source Oral, resp. rate 16, height _0  (1.803 m), weight 99.8 kg (220 lb), SpO2 97 %. Physical Exam  Constitutional: He appears well-developed and well-nourished. No distress.  HENT:  Head: Normocephalic and atraumatic.  Eyes: Conjunctivae are normal. Right eye exhibits no discharge. Left eye exhibits no discharge. No scleral icterus.  Neck: Normal range of motion.  Cardiovascular: Normal rate and regular rhythm.  Respiratory: Effort normal. No respiratory distress.  Musculoskeletal:  Right shoulder, elbow, wrist, digits-  no skin wounds, nontender, no instability, no blocks to motion  Sens  Ax/R/M/U intact but tips of 3-5 fingers paresthetic  Mot   Ax/ R/ PIN/ M/ AIN/ U intact  Rad 2+, Uln 1+  Neurological: He is alert.  Skin: Skin is warm and dry. He is not diaphoretic.  Psychiatric: He has a normal mood and affect. His behavior is normal.    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 Nicoletta Dress at [facility]. He should not expose hand to cold until he is seen and should keep it warm. No caffeine, no nicotine.    Lisette Abu, PA-C Orthopedic Surgery [phone] 02/07/2018, 9:11 AM

## 2018-02-07 NOTE — ED Provider Notes (Addendum)
[facility] EMERGENCY DEPARTMENT Provider Note   CSN: [id number] Arrival date & time: 02/06/18  1703     History   Chief Complaint Chief Complaint  Patient presents with  . Numbness    HPI Rodney Nunez is a 32 y.o. male.  The history is provided by the patient.  Illness  This is a new problem. The current episode started more than 1 week ago (2.5 weeks ago with numbness to the right (dominant Hand) 3 digits.  Now Ulnar 3 digits are discolored from DIP to the tips, including nails). The problem occurs constantly. The problem has been gradually worsening. Pertinent negatives include no chest pain, no abdominal pain, no headaches and no shortness of breath. Nothing aggravates the symptoms. Nothing relieves the symptoms. He has tried nothing for the symptoms. The treatment provided no relief.  Patient initially stated no trauma.  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 Ulnar 3 digits of the right hand numb x 3 weeks now discolored within past 48 hours.    Past Medical History:  Diagnosis Date  . Asthma   . Hypertension     Patient Active Problem List   Diagnosis Date Noted  . Childhood asthma 02/24/2014  . RHINITIS, ALLERGIC 01/10/2007  . ACNE 01/10/2007  . BICIPITAL TENDONITIS 01/10/2007    Past Surgical History:  Procedure Laterality Date  . MOUTH SURGERY          Home Medications    Prior to Admission medications   Medication Sig Start Date End Date Taking? Authorizing Provider  benzonatate (TESSALON) 100 MG capsule Take 1 capsule (100 mg total) by mouth every 8 (eight) hours. 01/05/18   Caccavale, Sophia, PA-C  butalbital-acetaminophen-caffeine (FIORICET, ESGIC) [phone] MG tablet Take 1-2 tablets by mouth every 8 (eight) hours as needed for headache. 06/30/17   Antony Madura, PA-C  cyclobenzaprine (FLEXERIL) 10 MG tablet Take 1 tablet (10 mg total) by mouth 2 (two) times daily as needed for  muscle spasms. 10/27/17   Fayrene Helper, PA-C  fluticasone (FLONASE) 50 MCG/ACT nasal spray Place 2 sprays into both nostrils daily. 01/05/18   Caccavale, Sophia, PA-C  ibuprofen (ADVIL,MOTRIN) 600 MG tablet Take 1 tablet (600 mg total) by mouth every 8 (eight) hours as needed for mild pain or moderate pain. 10/27/17   Fayrene Helper, PA-C  naproxen (NAPROSYN) 500 MG tablet Take 1 tablet (500 mg total) by mouth 2 (two) times daily. 11/04/17   Dione Booze, MD  promethazine-dextromethorphan (PROMETHAZINE-DM) 6.25-15 MG/5ML syrup Take 5 mLs by mouth 4 (four) times daily as needed. 01/05/18   Caccavale, Sophia, PA-C    Family History Family History  Problem Relation Age of Onset  . Heart attack Father   . Heart disease Father   . Hypertension Sister   . Hypertension Mother   . Diabetes Mother     Social History Social History   Tobacco Use  . Smoking status: Never Smoker  . Smokeless tobacco: Never Used  Substance Use Topics  . Alcohol use: Yes    Comment: occassional  . Drug use: No     Allergies   Penicillins   Review of Systems Review of Systems  Constitutional: Negative for fever.  Respiratory: Negative for shortness of breath.   Cardiovascular: Negative for chest pain.  Gastrointestinal: Negative for abdominal pain.  Musculoskeletal: Positive for joint swelling.  Skin: Positive for color change.  Neurological: Positive for numbness. Negative for weakness  and headaches.  All other systems reviewed and are negative.    Physical Exam Updated Vital Signs BP (!) 144/96   Pulse (!) 43   Temp 98 F (36.7 C) (Oral)   Resp 14   SpO2 100%   Physical Exam  Constitutional: He is oriented to person, place, and time. He appears well-developed and well-nourished. No distress.  HENT:  Head: Normocephalic and atraumatic.  Mouth/Throat: No oropharyngeal exudate.  Eyes: Pupils are equal, round, and reactive to light. Conjunctivae are normal.  Neck: Normal range of motion. Neck  supple.  Cardiovascular: Normal rate, regular rhythm, normal heart sounds and intact distal pulses.  Pulmonary/Chest: Effort normal and breath sounds normal. No stridor. He has no wheezes. He has no rales.  Abdominal: Soft. Bowel sounds are normal. There is no tenderness. There is no rebound and no guarding.  Musculoskeletal:       Right wrist: Normal.       Right hand: He exhibits decreased capillary refill. Decreased sensation noted. Normal strength noted.       Hands: Has dopplered pulses in ulnar 3 digits   Neurological: He is alert and oriented to person, place, and time.               ED Treatments / Results  Labs (all labs ordered are listed, but only abnormal results are displayed) Results for orders placed or performed during the hospital encounter of 02/07/18  Basic metabolic panel  Result Value Ref Range   Sodium 138 135 - 145 mmol/L   Potassium 3.9 3.5 - 5.1 mmol/L   Chloride 105 101 - 111 mmol/L   CO2 25 22 - 32 mmol/L   Glucose, Bld 106 (H) 65 - 99 mg/dL   BUN 14 6 - 20 mg/dL   Creatinine, Ser 4.090.93 0.61 - 1.24 mg/dL   Calcium 9.1 8.9 - 81.110.3 mg/dL   GFR calc non Af Amer >60 >60 mL/min   GFR calc Af Amer >60 >60 mL/min   Anion gap 8 5 - 15  CBC with Differential  Result Value Ref Range   WBC 4.0 4.0 - 10.5 K/uL   RBC 4.68 4.22 - 5.81 MIL/uL   Hemoglobin 13.4 13.0 - 17.0 g/dL   HCT 91.440.8 78.239.0 - 95.652.0 %   MCV 87.2 78.0 - 100.0 fL   MCH 28.6 26.0 - 34.0 pg   MCHC 32.8 30.0 - 36.0 g/dL   RDW 21.313.5 08.611.5 - 57.815.5 %   Platelets 297 150 - 400 K/uL   Neutrophils Relative % 60 %   Neutro Abs 2.4 1.7 - 7.7 K/uL   Lymphocytes Relative 31 %   Lymphs Abs 1.2 0.7 - 4.0 K/uL   Monocytes Relative 7 %   Monocytes Absolute 0.3 0.1 - 1.0 K/uL   Eosinophils Relative 2 %   Eosinophils Absolute 0.1 0.0 - 0.7 K/uL   Basophils Relative 0 %   Basophils Absolute 0.0 0.0 - 0.1 K/uL  Heparin level (unfractionated)  Result Value Ref Range   Heparin Unfractionated <0.10 (L)  0.30 - 0.70 IU/mL  POCT Activated clotting time  Result Value Ref Range   Activated Clotting Time 147 seconds   Ct Angio Up Extrem Right W &/or Wo Contrast  Result Date: 02/06/2018 CLINICAL DATA:  Tingling and numbness in the right hand for 2 weeks with progression today. Paresthesias. Discoloration of the finger tips. EXAM: CT ANGIOGRAPHY OF THE right upperEXTREMITY TECHNIQUE: Multidetector CT imaging of the right upperwas performed using the standard protocol  during bolus administration of intravenous contrast. Multiplanar CT image reconstructions and MIPs were obtained to evaluate the vascular anatomy. CONTRAST:  ISOVUE-370 IOPAMIDOL (ISOVUE-370) INJECTION 76% COMPARISON:  None. FINDINGS: The subclavian and axillary arteries normal. Proximal circumflex arteries are within normal limits. The bifurcation is unremarkable. The ulnar artery is main diminutive in the forearm. The radial artery can be followed to the wrist. Arteries are not well defined within the hand. There is no evidence for proximal vasculitis the stenosis, or occlusion. The aortic bifurcation iliac arteries are visualized. Proximal femoral arteries are within normal limits. Osseous structures of the extremity are within normal limits. Visualized right hemithorax is clear. Visualized abdominal structures are within limits. Additionally, there is some streak artifact across the forearm and hand. Review of the MIP images confirms the above findings. IMPRESSION: 1. No significant proximal stenosis, occlusion, or vasculitis in the hand upper extremity to the trifurcation. 2. The distal ulnar artery is not well visualized beyond the mid forearm, which may be a normal variant. 3. The radial artery is visualized to the wrist. 4. Vascular structures of the hand are not well visualized. This could be secondary to vasospasm associated with cold temperature injury. The vessels of the hand are not typically well seen with CTA. These results were  called by telephone at the time of interpretation on 02/06/2018 at 8:00 pm to [facility] , who verbally acknowledged these results. Electronically Signed   By: Marin Roberts M.D.   On: 02/06/2018 20:01   Ir Angiogram Extremity Right  Result Date: 02/07/2018 INDICATION: Ischemic right fingers EXAM: RIGHT UPPER EXTREMITY ANGIOGRAM.  ULTRASOUND GUIDANCE. MEDICATIONS: None ANESTHESIA/SEDATION: Moderate (conscious) sedation was employed during this procedure. A total of Versed 2 mg and Fentanyl 50 mcg was administered intravenously. Moderate Sedation Time: 75 minutes. The patient's level of consciousness and vital signs were monitored continuously by radiology nursing throughout the procedure under my direct supervision. CONTRAST:  76 cc Visipaque 320 FLUOROSCOPY TIME:  Fluoroscopy Time: 4 minutes 48 seconds (111 mGy). COMPLICATIONS: None PROCEDURE: Informed consent was obtained from the patient following explanation of the procedure, risks, benefits and alternatives. The patient understands, agrees and consents for the procedure. All questions were addressed. A time out was performed prior to the initiation of the procedure. Maximal barrier sterile technique utilized including caps, mask, sterile gowns, sterile gloves, large sterile drape, hand hygiene, and Betadine prep. The right groin was prepped and draped in a sterile fashion. 1% lidocaine was utilized for local anesthesia. Under sonographic guidance, a micropuncture needle was inserted into the right common femoral artery and removed over a 018 wire which was up sized to a Bentson. A 5 French sheath was inserted. A JB 1 catheter was advanced over the Bentson wire to the aortic arch. A CT angiogram of the right upper extremity was previously performed demonstrating wide patency of the aortic arch, innominate artery, right subclavian artery, and right axillary artery. The catheter was positioned in the innominate artery and a innominate artery  arteriogram was performed delineating the origin of the right subclavian artery. The catheter was then advanced into the right brachial artery and a right upper extremity runoff was performed. Imaging of the hand was performed before and after 100 mcg intra-arterial nitroglycerin. The catheter was removed. ExoSeal was deployed after sheath removal for hemostasis. Findings FINDINGS: The innominate artery, right subclavian artery, right brachial artery, right axillary artery, right radial artery, right ulnar artery, and interosseous artery are all widely patent. Imaging of the hand  demonstrates patency of the deep and superficial arches. There is patency of the common digital arteries. Within the thumb and index finger, there is wide patency of the proper digital arteries with adequate filling of the tufts. Regarding the long finger, ring finger, and small finger, the proper digital arteries fill to the distal phalanx, then there is very poor filling beyond the DIP joint. There is very poor filling of the tufts in these 3 digits. There is no focal intraluminal filling defect. There is no abrupt cut off of a vessel. There is also no multifocal areas of vessel narrowing to suggest vasculitis. After injection of 100 mcg intra arterial nitroglycerin, there was some improvement of the runoff into the tufts of the long finger, ring finger, and small finger. There is still diminished filling of the tops of these 3 digits. IMPRESSION: Right upper extremity angiography demonstrates no focal occlusion to the level of the wrist from the aortic arch. There is no filling defect to suggest thrombus. Specifically, the ulnar artery is widely patent. Within the hand, the arches and common digital arteries are widely patent. Vasculature to the index finger and thumb is normal. There is diminished filling of the tufts involving the long finger, ring finger, and small fingers. The proper digital arteries fill to the DIP joint of these 3  digits. After injecting intra arterial nitroglycerin, there is improved runoff and filling in the tufts but there was still diminished filling. These findings suggest a vaso constrictive process in the distal phalanges of the long finger, ring finger, and small finger. Electronically Signed   By: Jolaine Click M.D.   On: 02/07/2018 16:26   Ir US Guide Vasc Access Right  Result Date: 02/07/2018 INDICATION: Ischemic right fingers EXAM: RIGHT UPPER EXTREMITY ANGIOGRAM.  ULTRASOUND GUIDANCE. MEDICATIONS: None ANESTHESIA/SEDATION: Moderate (conscious) sedation was employed during this procedure. A total of Versed 2 mg and Fentanyl 50 mcg was administered intravenously. Moderate Sedation Time: 75 minutes. The patient's level of consciousness and vital signs were monitored continuously by radiology nursing throughout the procedure under my direct supervision. CONTRAST:  76 cc Visipaque 320 FLUOROSCOPY TIME:  Fluoroscopy Time: 4 minutes 48 seconds (111 mGy). COMPLICATIONS: None PROCEDURE: Informed consent was obtained from the patient following explanation of the procedure, risks, benefits and alternatives. The patient understands, agrees and consents for the procedure. All questions were addressed. A time out was performed prior to the initiation of the procedure. Maximal barrier sterile technique utilized including caps, mask, sterile gowns, sterile gloves, large sterile drape, hand hygiene, and Betadine prep. The right groin was prepped and draped in a sterile fashion. 1% lidocaine was utilized for local anesthesia. Under sonographic guidance, a micropuncture needle was inserted into the right common femoral artery and removed over a 018 wire which was up sized to a Bentson. A 5 French sheath was inserted. A JB 1 catheter was advanced over the Bentson wire to the aortic arch. A CT angiogram of the right upper extremity was previously performed demonstrating wide patency of the aortic arch, innominate artery, right  subclavian artery, and right axillary artery. The catheter was positioned in the innominate artery and a innominate artery arteriogram was performed delineating the origin of the right subclavian artery. The catheter was then advanced into the right brachial artery and a right upper extremity runoff was performed. Imaging of the hand was performed before and after 100 mcg intra-arterial nitroglycerin. The catheter was removed. ExoSeal was deployed after sheath removal for hemostasis. Findings FINDINGS: The  innominate artery, right subclavian artery, right brachial artery, right axillary artery, right radial artery, right ulnar artery, and interosseous artery are all widely patent. Imaging of the hand demonstrates patency of the deep and superficial arches. There is patency of the common digital arteries. Within the thumb and index finger, there is wide patency of the proper digital arteries with adequate filling of the tufts. Regarding the long finger, ring finger, and small finger, the proper digital arteries fill to the distal phalanx, then there is very poor filling beyond the DIP joint. There is very poor filling of the tufts in these 3 digits. There is no focal intraluminal filling defect. There is no abrupt cut off of a vessel. There is also no multifocal areas of vessel narrowing to suggest vasculitis. After injection of 100 mcg intra arterial nitroglycerin, there was some improvement of the runoff into the tufts of the long finger, ring finger, and small finger. There is still diminished filling of the tops of these 3 digits. IMPRESSION: Right upper extremity angiography demonstrates no focal occlusion to the level of the wrist from the aortic arch. There is no filling defect to suggest thrombus. Specifically, the ulnar artery is widely patent. Within the hand, the arches and common digital arteries are widely patent. Vasculature to the index finger and thumb is normal. There is diminished filling of the  tufts involving the long finger, ring finger, and small fingers. The proper digital arteries fill to the DIP joint of these 3 digits. After injecting intra arterial nitroglycerin, there is improved runoff and filling in the tufts but there was still diminished filling. These findings suggest a vaso constrictive process in the distal phalanges of the long finger, ring finger, and small finger. Electronically Signed   By: Jolaine Click M.D.   On: 02/07/2018 16:26    None  Radiology Ct Angio Up Extrem Right W &/or Wo Contrast  Result Date: 02/06/2018 CLINICAL DATA:  Tingling and numbness in the right hand for 2 weeks with progression today. Paresthesias. Discoloration of the finger tips. EXAM: CT ANGIOGRAPHY OF THE right upperEXTREMITY TECHNIQUE: Multidetector CT imaging of the right upperwas performed using the standard protocol during bolus administration of intravenous contrast. Multiplanar CT image reconstructions and MIPs were obtained to evaluate the vascular anatomy. CONTRAST:  ISOVUE-370 IOPAMIDOL (ISOVUE-370) INJECTION 76% COMPARISON:  None. FINDINGS: The subclavian and axillary arteries normal. Proximal circumflex arteries are within normal limits. The bifurcation is unremarkable. The ulnar artery is main diminutive in the forearm. The radial artery can be followed to the wrist. Arteries are not well defined within the hand. There is no evidence for proximal vasculitis the stenosis, or occlusion. The aortic bifurcation iliac arteries are visualized. Proximal femoral arteries are within normal limits. Osseous structures of the extremity are within normal limits. Visualized right hemithorax is clear. Visualized abdominal structures are within limits. Additionally, there is some streak artifact across the forearm and hand. Review of the MIP images confirms the above findings. IMPRESSION: 1. No significant proximal stenosis, occlusion, or vasculitis in the hand upper extremity to the trifurcation. 2.  The distal ulnar artery is not well visualized beyond the mid forearm, which may be a normal variant. 3. The radial artery is visualized to the wrist. 4. Vascular structures of the hand are not well visualized. This could be secondary to vasospasm associated with cold temperature injury. The vessels of the hand are not typically well seen with CTA. These results were called by telephone at the time of  interpretation on 02/06/2018 at 8:00 pm to [facility] , who verbally acknowledged these results. Electronically Signed   By: Marin Roberts M.D.   On: 02/06/2018 20:01    Procedures Procedures (including critical care time)  Medications Ordered in ED Medications  NIFEdipine (PROCARDIA-XL/ADALAT CC) 24 hr tablet 30 mg (has no administration in time range)  fentaNYL (SUBLIMAZE) injection 50 mcg (50 mcg Intramuscular Given 02/06/18 1746)  iopamidol (ISOVUE-370) 76 % injection 100 mL (100 mLs Intravenous Contrast Given 02/06/18 1802)  nitroGLYCERIN (NITROGLYN) 2 % ointment 1 inch (1 inch Topical Given 02/07/18 0159)  acetaminophen (TYLENOL) tablet 1,000 mg (1,000 mg Oral Given 02/07/18 0200)  sodium chloride 0.9 % bolus 1,000 mL (1,000 mLs Intravenous New Bag/Given 02/07/18 0210)    Nitroglycerin ointment 2% to the digits in glove with sock with heating packings, IVF flowing in order to increase flow by me   30 mg of Nifedipine for vasospasm ordered, repeated x 1   305 Case d/w Dr. Orlan Leavens, hand does not take care of this problem at [facility].  Transfer patient     312:  Case d/w Dr. Kathie Dike of hand doppler fingers.  Was able doppler pulses in ulnar digits.  Please try nifedipine.    655 Seen by Dr. Randie Heinz, needs hand surgery.  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    Case d/w Dr. Dyke Maes at [facility], declines transfer as it needs IR and hand medicine admission with hand consult   820 Case d/w Dr. Doran Durand of hand at [facility], he agrees it needs hand surgery but [facility] is on full divert     840 Versie Starks of hand will see patient in consult.  EDP was fully honest that previous hand attending stated that he would not see patient and it needed transfer   850 case d/w IR please stop heparin for angiogram   MDM Interpretation: labs and CT scan Total time providing critical care: 75-105 minutes (IV heparin as well as multiple reevaluations of interventions). This excludes time spent performing separately reportable procedures and services. Consults: vascular at [facility] IR at [facility], hand at [facility] hand at [facility] hand at [facility].  CRITICAL CARE Performed by: Jasmine Awe Total critical care time: 90 minutes Critical care time was exclusive of separately billable procedures and treating other patients. Critical care was necessary to treat or prevent imminent or life-threatening deterioration. Critical care was time spent personally by me on the following activities: development of treatment plan with patient and/or surrogate as well as nursing, discussions with consultants, evaluation of patient's response to treatment, examination of patient, obtaining history from patient or surrogate, ordering and performing treatments and interventions, ordering and review of laboratory studies, ordering and review of radiographic studies, pulse oximetry and re-evaluation of patient's condition.  Final Clinical Impressions(s) / ED Diagnoses   Signed out to Dr. Donnald Garre pending angiogram    Felicie Kocher, MD 02/07/18 1610    Nicanor Alcon, Lovinia Snare, MD 02/08/18 [id number]

## 2018-02-07 NOTE — Discharge Instructions (Signed)
Keep hand warm.  No caffeine or nicotine products.

## 2018-02-07 NOTE — ED Notes (Signed)
Paged [facility] for pt transfer per Dr. Donnald GarrePfeiffer

## 2018-02-07 NOTE — Procedures (Signed)
RUE Angio Find - No prox occlusion or thrombus. Poor runoff in tufts of long, ring, small fingers without abrupt vessel cutoff. Hand images performed pre and post nitro.  EBL 0 Comp 0

## 2018-02-07 NOTE — Consult Note (Signed)
Chief Complaint: Patient was seen in consultation today for right hand numbness  Referring Physician(s): Dr. Nicanor AlconPalumbo  Supervising Physician: Jolaine ClickHoss, Arthur  Patient Status: [facility] - [facility]  History of Present Illness: Rodney Nunez is a 32 y.o. male with past medical history of asthma and HTN [facility] yesterday with pain in his 3rd, 4th, and 5th finger of the right hand.  Patient works in a Industrial/product designerpacking freezer and spent most of the day yesterday (from 8AM-330PM) working in the freezer.  He was wearing gloves, but noticed his hands were cold and numb while working.  After work was completed his left hand regained feeling and warmth, however his right hand was numb and painful with discoloration of the right three fingers.  He presented to the ED for evaluation.   CTA Right Extremity shows: 1. No significant proximal stenosis, occlusion, or vasculitis in the hand upper extremity to the trifurcation. 2. The distal ulnar artery is not well visualized beyond the mid forearm, which may be a normal variant. 3. The radial artery is visualized to the wrist. 4. Vascular structures of the hand are not well visualized. This could be secondary to vasospasm associated with cold temperature injury. The vessels of the hand are not typically well seen with CTA.  Due to concern for vascular etiology, Vascular Surgery was consulted.  Per EDP, they have evaluated the patient and feel that patient has adequate blood flow due to palpable pulses.  Hand surgery has also been consulted.   IR now consulted for possible angiogram.  Case reviewed with Dr. Bonnielee HaffHoss who approves patient for angiogram with possible intervention.    Past Medical History:  Diagnosis Date  . Asthma   . Hypertension     Past Surgical History:  Procedure Laterality Date  . MOUTH SURGERY      Allergies: Penicillins  Medications: Prior to Admission medications   Medication Sig Start Date End Date Taking? Authorizing  Provider  fluticasone (FLONASE) 50 MCG/ACT nasal spray Place 2 sprays into both nostrils daily. 01/05/18  Yes Caccavale, Sophia, PA-C  naproxen (NAPROSYN) 500 MG tablet Take 1 tablet (500 mg total) by mouth 2 (two) times daily. 11/04/17  Yes Dione BoozeGlick, David, MD  promethazine-dextromethorphan (PROMETHAZINE-DM) 6.25-15 MG/5ML syrup Take 5 mLs by mouth 4 (four) times daily as needed. 01/05/18  Yes Caccavale, Sophia, PA-C  benzonatate (TESSALON) 100 MG capsule Take 1 capsule (100 mg total) by mouth every 8 (eight) hours. Patient not taking: Reported on 02/07/2018 01/05/18   Caccavale, Sophia, PA-C  butalbital-acetaminophen-caffeine (FIORICET, ESGIC) 50-325-40 MG tablet Take 1-2 tablets by mouth every 8 (eight) hours as needed for headache. Patient not taking: Reported on 02/07/2018 06/30/17   Antony MaduraHumes, Kelly, PA-C  cyclobenzaprine (FLEXERIL) 10 MG tablet Take 1 tablet (10 mg total) by mouth 2 (two) times daily as needed for muscle spasms. Patient not taking: Reported on 02/07/2018 10/27/17   Fayrene Helperran, Bowie, PA-C     Family History  Problem Relation Age of Onset  . Heart attack Father   . Heart disease Father   . Hypertension Sister   . Hypertension Mother   . Diabetes Mother     Social History   Socioeconomic History  . Marital status: Single    Spouse name: Not on file  . Number of children: Not on file  . Years of education: Not on file  . Highest education level: Not on file  Occupational History  . Not on file  Social Needs  . Financial  resource strain: Not on file  . Food insecurity:    Worry: Not on file    Inability: Not on file  . Transportation needs:    Medical: Not on file    Non-medical: Not on file  Tobacco Use  . Smoking status: Never Smoker  . Smokeless tobacco: Never Used  Substance and Sexual Activity  . Alcohol use: Yes    Comment: occassional  . Drug use: No  . Sexual activity: Not on file  Lifestyle  . Physical activity:    Days per week: Not on file    Minutes per  session: Not on file  . Stress: Not on file  Relationships  . Social connections:    Talks on phone: Not on file    Gets together: Not on file    Attends religious service: Not on file    Active member of club or organization: Not on file    Attends meetings of clubs or organizations: Not on file    Relationship status: Not on file  Other Topics Concern  . Not on file  Social History Narrative  . Not on file     Review of Systems: A 12 point ROS discussed and pertinent positives are indicated in the HPI above.  All other systems are negative.  Review of Systems  Constitutional: Negative for fatigue and fever.  Respiratory: Negative for cough and shortness of breath.   Cardiovascular: Negative for chest pain.  Gastrointestinal: Negative for abdominal pain.  Skin: Positive for color change (right hand) and wound (right hand).  Psychiatric/Behavioral: Negative for behavioral problems and confusion.    Vital Signs: BP 128/76   Pulse (!) 45   Temp 98 F (36.7 C) (Oral)   Resp 16   Ht 5\' 11"  (1.803 m)   Wt 220 lb (99.8 kg)   SpO2 97%   BMI 30.68 kg/m   Physical Exam  Constitutional: He is oriented to person, place, and time. He appears well-developed.  Cardiovascular: Normal rate, regular rhythm and normal heart sounds.  Pulmonary/Chest: Effort normal and breath sounds normal. No respiratory distress.  Abdominal: Soft.  Musculoskeletal:  3rd, 4th, and 5th finger tips of the right hand are swollen and blue.  Hard to the touch. Cool to touch.  Open blister vs. Ulcer to dorsal side of 3rd finger.  Poor capillary refill  Neurological: He is alert and oriented to person, place, and time.  Skin: Skin is warm and dry.  Psychiatric: He has a normal mood and affect. His behavior is normal. Judgment and thought content normal.  Nursing note and vitals reviewed.    MD Evaluation Airway: WNL Heart: WNL Abdomen: WNL Chest/ Lungs: WNL ASA  Classification: 3 Mallampati/Airway  Score: One   Imaging: Ct Angio Up Extrem Right W &/or Wo Contrast  Result Date: 02/06/2018 CLINICAL DATA:  Tingling and numbness in the right hand for 2 weeks with progression today. Paresthesias. Discoloration of the finger tips. EXAM: CT ANGIOGRAPHY OF THE right upperEXTREMITY TECHNIQUE: Multidetector CT imaging of the right upperwas performed using the standard protocol during bolus administration of intravenous contrast. Multiplanar CT image reconstructions and MIPs were obtained to evaluate the vascular anatomy. CONTRAST:  ISOVUE-370 IOPAMIDOL (ISOVUE-370) INJECTION 76% COMPARISON:  None. FINDINGS: The subclavian and axillary arteries normal. Proximal circumflex arteries are within normal limits. The bifurcation is unremarkable. The ulnar artery is main diminutive in the forearm. The radial artery can be followed to the wrist. Arteries are not well defined within the  hand. There is no evidence for proximal vasculitis the stenosis, or occlusion. The aortic bifurcation iliac arteries are visualized. Proximal femoral arteries are within normal limits. Osseous structures of the extremity are within normal limits. Visualized right hemithorax is clear. Visualized abdominal structures are within limits. Additionally, there is some streak artifact across the forearm and hand. Review of the MIP images confirms the above findings. IMPRESSION: 1. No significant proximal stenosis, occlusion, or vasculitis in the hand upper extremity to the trifurcation. 2. The distal ulnar artery is not well visualized beyond the mid forearm, which may be a normal variant. 3. The radial artery is visualized to the wrist. 4. Vascular structures of the hand are not well visualized. This could be secondary to vasospasm associated with cold temperature injury. The vessels of the hand are not typically well seen with CTA. These results were called by telephone at the time of interpretation on 02/06/2018 at 8:00 pm to [facility] ,  who verbally acknowledged these results. Electronically Signed   By: Marin Roberts M.D.   On: 02/06/2018 20:01    Labs:  CBC: Recent Labs    02/06/18 1743  WBC 4.0  HGB 13.4  HCT 40.8  PLT 297    COAGS: No results for input(s): INR, APTT in the last 8760 hours.  BMP: Recent Labs    02/06/18 1743  NA 138  K 3.9  CL 105  CO2 25  GLUCOSE 106*  BUN 14  CALCIUM 9.1  CREATININE 0.93  GFRNONAA >60  GFRAA >60    LIVER FUNCTION TESTS: No results for input(s): BILITOT, AST, ALT, ALKPHOS, PROT, ALBUMIN in the last 8760 hours.  TUMOR MARKERS: No results for input(s): AFPTM, CEA, CA199, CHROMGRNA in the last 8760 hours.  Assessment and Plan: Patient with past medical history of HTN, asthma presents with complaint of right hand numbness.  IR consulted for angiogram and possible intervention at the request of Dr. Nicanor Alcon. Case reviewed by Dr. Bonnielee Haff who approves patient for procedure.  He has been NPO overnight and this AM.  He is currently on heparin- will hold for procedure this AM. Follow for Hand eval and input.  Risks and benefits were discussed with the patient including, but not limited to bleeding, infection, vascular injury or contrast induced renal failure.  This interventional procedure involves the use of X-rays and because of the nature of the planned procedure, it is possible that we will have prolonged use of X-ray fluoroscopy.  Potential radiation risks to you include (but are not limited to) the following: - A slightly elevated risk for cancer  several years later in life. This risk is typically less than 0.5% percent. This risk is low in comparison to the normal incidence of human cancer, which is 33% for women and 50% for men according to the American Cancer Society. - Radiation induced injury can include skin redness, resembling a rash, tissue breakdown / ulcers and hair loss (which can be temporary or permanent).   The likelihood of either of  these occurring depends on the difficulty of the procedure and whether you are sensitive to radiation due to previous procedures, disease, or genetic conditions.   IF your procedure requires a prolonged use of radiation, you will be notified and given written instructions for further action.  It is your responsibility to monitor the irradiated area for the 2 weeks following the procedure and to notify your physician if you are concerned that you have suffered a radiation induced injury.  All of the patient's questions were answered, patient is agreeable to proceed.  Consent signed and in chart.  Thank you for this interesting consult.  I greatly enjoyed meeting Rodney Nunez and look forward to participating in their care.  A copy of this report was sent to the requesting provider on this date.  Electronically Signed: Hoyt Koch, PA 02/07/2018, 8:52 AM   I spent a total of 40 Minutes    in face to face in clinical consultation, greater than 50% of which was counseling/coordinating care for right hand numbness.

## 2018-02-07 NOTE — ED Notes (Signed)
Patient is resting with call bell in reach  

## 2018-02-07 NOTE — ED Notes (Signed)
[facility]

## 2018-05-31 ENCOUNTER — Encounter (HOSPITAL_COMMUNITY): Payer: Self-pay | Admitting: Obstetrics and Gynecology

## 2018-05-31 ENCOUNTER — Emergency Department (HOSPITAL_COMMUNITY)
Admission: EM | Admit: 2018-05-31 | Discharge: 2018-05-31 | Disposition: A | Discharge status: Home / Self Care | Payer: 59 | Attending: Emergency Medicine | Admitting: Emergency Medicine

## 2018-05-31 ENCOUNTER — Other Ambulatory Visit: Payer: Self-pay

## 2018-05-31 DIAGNOSIS — I10 Essential (primary) hypertension: Secondary | ICD-10-CM | POA: Diagnosis not present

## 2018-05-31 DIAGNOSIS — R531 Weakness: Secondary | ICD-10-CM | POA: Diagnosis not present

## 2018-05-31 DIAGNOSIS — J45909 Unspecified asthma, uncomplicated: Secondary | ICD-10-CM | POA: Diagnosis not present

## 2018-05-31 DIAGNOSIS — E860 Dehydration: Secondary | ICD-10-CM | POA: Insufficient documentation

## 2018-05-31 DIAGNOSIS — R51 Headache: Secondary | ICD-10-CM | POA: Diagnosis present

## 2018-05-31 LAB — I-STAT CHEM 8, ED
BUN: 24 mg/dL — ABNORMAL HIGH (ref 6–20)
Calcium, Ion: 1.17 mmol/L (ref 1.15–1.40)
Chloride: 99 mmol/L (ref 98–111)
Creatinine, Ser: 1.3 mg/dL — ABNORMAL HIGH (ref 0.61–1.24)
Glucose, Bld: 99 mg/dL (ref 70–99)
HCT: 53 % — ABNORMAL HIGH (ref 39.0–52.0)
Hemoglobin: 18 g/dL — ABNORMAL HIGH (ref 13.0–17.0)
Potassium: 5 mmol/L (ref 3.5–5.1)
Sodium: 137 mmol/L (ref 135–145)
TCO2: 28 mmol/L (ref 22–32)

## 2018-05-31 MED ORDER — SODIUM CHLORIDE 0.9 % IV BOLUS
1000.0000 mL | Freq: Once | INTRAVENOUS | Status: AC
  Administered 2018-05-31: 1000 mL via INTRAVENOUS

## 2018-05-31 NOTE — ED Triage Notes (Signed)
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 Neurologic exam unremarkable

## 2018-05-31 NOTE — ED Provider Notes (Signed)
[facility]-EMERGENCY DEPT Provider Note   CSN: [id number] Arrival date & time: 05/31/18  1333     History   Chief Complaint Chief Complaint  Patient presents with  . Headache  . Dehydration    HPI Rodney Nunez is a 32 y.o. male.  HPI Patient presents with concern of headache, weakness. 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 Patient has not no medical problems takes no medication regularly, is generally healthy. He denies vision changes, confusion, disorientation, other complaints. He has not taken any additional water since symptoms began while he was at work just prior to ED arrival.  Past Medical History:  Diagnosis Date  . Asthma   . Hypertension     Patient Active Problem List   Diagnosis Date Noted  . Hand pain 02/07/2018  . Childhood asthma 02/24/2014  . RHINITIS, ALLERGIC 01/10/2007  . ACNE 01/10/2007  . BICIPITAL TENDONITIS 01/10/2007    Past Surgical History:  Procedure Laterality Date  . IR ANGIOGRAM EXTREMITY RIGHT  02/07/2018  . IR US GUIDE VASC ACCESS RIGHT  02/07/2018  . MOUTH SURGERY          Home Medications    Prior to Admission medications   Medication Sig Start Date End Date Taking? Authorizing Provider  benzonatate (TESSALON) 100 MG capsule Take 1 capsule (100 mg total) by mouth every 8 (eight) hours. Patient not taking: Reported on 02/07/2018 01/05/18   Caccavale, Sophia, PA-C  butalbital-acetaminophen-caffeine (FIORICET, ESGIC) 50-325-40 MG tablet Take 1-2 tablets by mouth every 8 (eight) hours as needed for headache. Patient not taking: Reported on 02/07/2018 06/30/17   Antony Madura, PA-C  cyclobenzaprine (FLEXERIL) 10 MG tablet Take 1 tablet (10 mg total) by mouth 2 (two) times daily as needed for muscle spasms. Patient not taking: Reported on 02/07/2018 10/27/17   Fayrene Helper, PA-C  fluticasone [facility]) 50 MCG/ACT nasal spray Place 2 sprays into both  nostrils daily. 01/05/18   Caccavale, Sophia, PA-C  naproxen (NAPROSYN) 500 MG tablet Take 1 tablet (500 mg total) by mouth 2 (two) times daily. 11/04/17   Dione Booze, MD  promethazine-dextromethorphan (PROMETHAZINE-DM) 6.25-15 MG/5ML syrup Take 5 mLs by mouth 4 (four) times daily as needed. 01/05/18   Caccavale, Sophia, PA-C    Family History Family History  Problem Relation Age of Onset  . Heart attack Father   . Heart disease Father   . Hypertension Sister   . Hypertension Mother   . Diabetes Mother     Social History Social History   Tobacco Use  . Smoking status: Never Smoker  . Smokeless tobacco: Never Used  Substance Use Topics  . Alcohol use: Yes    Comment: occassional  . Drug use: No     Allergies   Penicillins   Review of Systems Review of Systems  Constitutional:       Per HPI, otherwise negative  HENT:       Per HPI, otherwise negative  Respiratory:       Per HPI, otherwise negative  Cardiovascular:       Per HPI, otherwise negative  Gastrointestinal: Negative for vomiting.  Endocrine:       Negative aside from HPI  Genitourinary:       Neg aside from HPI   Musculoskeletal:       Per HPI, otherwise negative  Skin: Negative.   Neurological: Negative for syncope.     Physical Exam Updated Vital Signs BP Marland Kitchen)  145/97 (BP Location: Left Arm)   Pulse 73   Temp 97.9 F (36.6 C) (Oral)   Resp 16   Ht 6' (1.829 m)   Wt 94.8 kg (209 lb)   SpO2 97%   BMI 28.35 kg/m   Physical Exam  Constitutional: He is oriented to person, place, and time. He appears well-developed. No distress.  HENT:  Head: Normocephalic and atraumatic.  Eyes: Conjunctivae and EOM are normal.  Cardiovascular: Normal rate and regular rhythm.  Pulmonary/Chest: Effort normal. No stridor. No respiratory distress.  Abdominal: He exhibits no distension.  Musculoskeletal: He exhibits no edema.  Neurological: He is alert and oriented to person, place, and time. He displays no  atrophy and no tremor. He exhibits normal muscle tone. He displays no seizure activity.  Skin: Skin is warm and dry.  Psychiatric: He has a normal mood and affect.  Nursing note and vitals reviewed.    ED Treatments / Results  Labs (all labs ordered are listed, but only abnormal results are displayed) Labs Reviewed  I-STAT CHEM 8, ED - Abnormal; Notable for the following components:      Result Value   BUN 24 (*)    Creatinine, Ser 1.30 (*)    Hemoglobin 18.0 (*)    HCT 53.0 (*)    All other components within normal limits    EKG None  Radiology No results found.  Procedures Procedures (including critical care time)  Medications Ordered in ED Medications  sodium chloride 0.9 % bolus 1,000 mL (1,000 mLs Intravenous New Bag/Given 05/31/18 1517)  sodium chloride 0.9 % bolus 1,000 mL (0 mLs Intravenous Stopped 05/31/18 1517)     Initial Impression / Assessment and Plan / ED Course  I have reviewed the triage vital signs and the nursing notes.  Pertinent labs & imaging results that were available during my care of the patient were reviewed by me and considered in my medical decision making (see chart for details).     3:36 PM Patient in no distress, headache has resolved. Labs notable for elevated creatinine. Patient's findings, presentation consistent with dehydration. Headache likely related to this, with no neurologic deficits, and with resolution of his headache, patient will continue fluid resuscitation, and after 2 L be discharged, with instructions on home care.   Final Clinical Impressions(s) / ED Diagnoses  Dehydration Bad headache   Gerhard MunchLockwood, Elber Galyean, MD 05/31/18 1536

## 2018-05-31 NOTE — Discharge Instructions (Addendum)
As discussed, your evaluation today has been largely reassuring.  But, it is important that you monitor your condition carefully, and do not hesitate to return to the ED if you develop new, or concerning changes in your condition. ? ?Otherwise, please follow-up with your physician for appropriate ongoing care. ? ?

## 2019-02-20 ENCOUNTER — Other Ambulatory Visit: Payer: Self-pay

## 2019-02-20 ENCOUNTER — Emergency Department (HOSPITAL_COMMUNITY)
Admission: EM | Admit: 2019-02-20 | Discharge: 2019-02-20 | Disposition: A | Discharge status: Home / Self Care | Payer: Managed Care, Other (non HMO) | Attending: Emergency Medicine | Admitting: Emergency Medicine

## 2019-02-20 ENCOUNTER — Encounter (HOSPITAL_COMMUNITY): Payer: Self-pay | Admitting: Emergency Medicine

## 2019-02-20 DIAGNOSIS — J45909 Unspecified asthma, uncomplicated: Secondary | ICD-10-CM | POA: Insufficient documentation

## 2019-02-20 DIAGNOSIS — R238 Other skin changes: Secondary | ICD-10-CM

## 2019-02-20 DIAGNOSIS — R220 Localized swelling, mass and lump, head: Secondary | ICD-10-CM | POA: Diagnosis present

## 2019-02-20 DIAGNOSIS — I10 Essential (primary) hypertension: Secondary | ICD-10-CM | POA: Insufficient documentation

## 2019-02-20 MED ORDER — HYDROCORTISONE 1 % EX CREA: TOPICAL_CREAM | CUTANEOUS | 0 refills | Status: DC

## 2019-02-20 NOTE — Discharge Instructions (Signed)
Return to the ED for worsening symptoms, increased swelling, fever or drainage from the area.

## 2019-02-20 NOTE — ED Triage Notes (Signed)
Pt c/o on knot on posterior right side of head for 2 days that is painful when laying on it.  Pt was on a cruise back March first to [place].

## 2019-02-20 NOTE — ED Provider Notes (Signed)
[facility]-EMERGENCY DEPT Provider Note   CSN: [id number] Arrival date & time: 02/20/19  [id number]    History   Chief Complaint Chief Complaint  Patient presents with  . knot on head    HPI Rodney Nunez is a 33 y.o. male with a past medical history of asthma, hypertension who presents to ED for knot to back of head for the past 2 days.  He is unsure what may have caused these symptoms.  States that the area is irritated and somewhat painful with palpation.  Denies history of similar symptoms in the past.  Denies any fevers, drainage from the area.     HPI  Past Medical History:  Diagnosis Date  . Asthma   . Hypertension     Patient Active Problem List   Diagnosis Date Noted  . Hand pain 02/07/2018  . Childhood asthma 02/24/2014  . RHINITIS, ALLERGIC 01/10/2007  . ACNE 01/10/2007  . BICIPITAL TENDONITIS 01/10/2007    Past Surgical History:  Procedure Laterality Date  . IR ANGIOGRAM EXTREMITY RIGHT  02/07/2018  . IR US GUIDE VASC ACCESS RIGHT  02/07/2018  . MOUTH SURGERY          Home Medications    Prior to Admission medications   Medication Sig Start Date End Date Taking? Authorizing Provider  benzonatate (TESSALON) 100 MG capsule Take 1 capsule (100 mg total) by mouth every 8 (eight) hours. Patient not taking: Reported on 02/07/2018 01/05/18   Caccavale, Sophia, PA-C  butalbital-acetaminophen-caffeine (FIORICET, ESGIC) 50-325-40 MG tablet Take 1-2 tablets by mouth every 8 (eight) hours as needed for headache. Patient not taking: Reported on 02/07/2018 06/30/17   Antony MaduraHumes, Kelly, PA-C  cyclobenzaprine (FLEXERIL) 10 MG tablet Take 1 tablet (10 mg total) by mouth 2 (two) times daily as needed for muscle spasms. Patient not taking: Reported on 02/07/2018 10/27/17   Fayrene Helperran, Bowie, PA-C  fluticasone [facility](FLONASE) 50 MCG/ACT nasal spray Place 2 sprays into both nostrils daily. 01/05/18   Caccavale, Sophia, PA-C  hydrocortisone cream 1 % Apply to affected area  2 times daily 02/20/19   Prentis Langdon, PA-C  naproxen (NAPROSYN) 500 MG tablet Take 1 tablet (500 mg total) by mouth 2 (two) times daily. 11/04/17   Dione BoozeGlick, David, MD  promethazine-dextromethorphan (PROMETHAZINE-DM) 6.25-15 MG/5ML syrup Take 5 mLs by mouth 4 (four) times daily as needed. 01/05/18   Caccavale, Sophia, PA-C    Family History Family History  Problem Relation Age of Onset  . Heart attack Father   . Heart disease Father   . Hypertension Sister   . Hypertension Mother   . Diabetes Mother     Social History Social History   Tobacco Use  . Smoking status: Never Smoker  . Smokeless tobacco: Never Used  Substance Use Topics  . Alcohol use: Yes    Comment: occassional  . Drug use: No     Allergies   Penicillins   Review of Systems Review of Systems  Constitutional: Negative for chills and fever.  Skin: Negative for wound.       +lump on head  Neurological: Negative for headaches.     Physical Exam Updated Vital Signs BP (!) 145/91 (BP Location: Left Arm)   Pulse 68   Temp 98.1 F (36.7 C) (Oral)   Resp 15   SpO2 100%   Physical Exam Vitals signs and nursing note reviewed.  Constitutional:      General: He is not in acute distress.    Appearance: He  is well-developed. He is not diaphoretic.  HENT:     Head: Normocephalic and atraumatic.  Eyes:     General: No scleral icterus.    Conjunctiva/sclera: Conjunctivae normal.  Neck:     Musculoskeletal: Normal range of motion.  Pulmonary:     Effort: Pulmonary effort is normal. No respiratory distress.  Skin:    Findings: No rash.     Comments: 2cm area of induration to right posterior head with dry flaky skin noted.  No fluctuance, warmth or erythema noted.  Neurological:     Mental Status: He is alert.      ED Treatments / Results  Labs (all labs ordered are listed, but only abnormal results are displayed) Labs Reviewed - No data to display  EKG None  Radiology No results found.   Procedures Procedures (including critical care time)  Medications Ordered in ED Medications - No data to display   Initial Impression / Assessment and Plan / ED Course  I have reviewed the triage vital signs and the nursing notes.  Pertinent labs & imaging results that were available during my care of the patient were reviewed by me and considered in my medical decision making (see chart for details).        33yo M presents to ED for lump on back of head for 2 days. On my exam there is an area of minimal induration noted to R posterior head with dry flaky skin without swelling or erythema. He denies any other complaints. Not sure what is causing these symptoms, but will rx cortisone cream for discomfort. No fluctuance warranting I&D at this time. Will have him f/u with PCP, return to ED if worse.  Patient is hemodynamically stable, in NAD, and able to ambulate in the ED. Evaluation does not show pathology that would require ongoing emergent intervention or inpatient treatment. I explained the diagnosis to the patient. Pain has been managed and has no complaints prior to discharge. Patient is comfortable with above plan and is stable for discharge at this time. All questions were answered prior to disposition. Strict return precautions for returning to the ED were discussed. Encouraged follow up with PCP.   An After Visit Summary was printed and given to the patient.   Portions of this note were generated with Scientist, clinical (histocompatibility and immunogenetics). Dictation errors may occur despite best attempts at proofreading.   Final Clinical Impressions(s) / ED Diagnoses   Final diagnoses:  Skin irritation    ED Discharge Orders         Ordered    hydrocortisone cream 1 %     02/20/19 0745           Dietrich Pates, PA-C 02/20/19 0747    Raeford Razor, MD 02/20/19 [phone]

## 2019-05-28 ENCOUNTER — Other Ambulatory Visit: Payer: Self-pay | Admitting: *Deleted

## 2019-05-28 DIAGNOSIS — Z20822 Contact with and (suspected) exposure to covid-19: Secondary | ICD-10-CM

## 2019-06-01 LAB — NOVEL CORONAVIRUS, NAA: SARS-CoV-2, NAA: NOT DETECTED

## 2019-10-21 IMAGING — DX DG CHEST 2V
2 series · 2 of 2 positions shown · non-contrast
Comparison: 06/14/2016

CLINICAL DATA: Cough/Zinad, chills, body aches, headache and SOB all
x 2-3 days, nonsmoker. Hx asthma.

EXAM:
CHEST  2 VIEW

[chest pa]
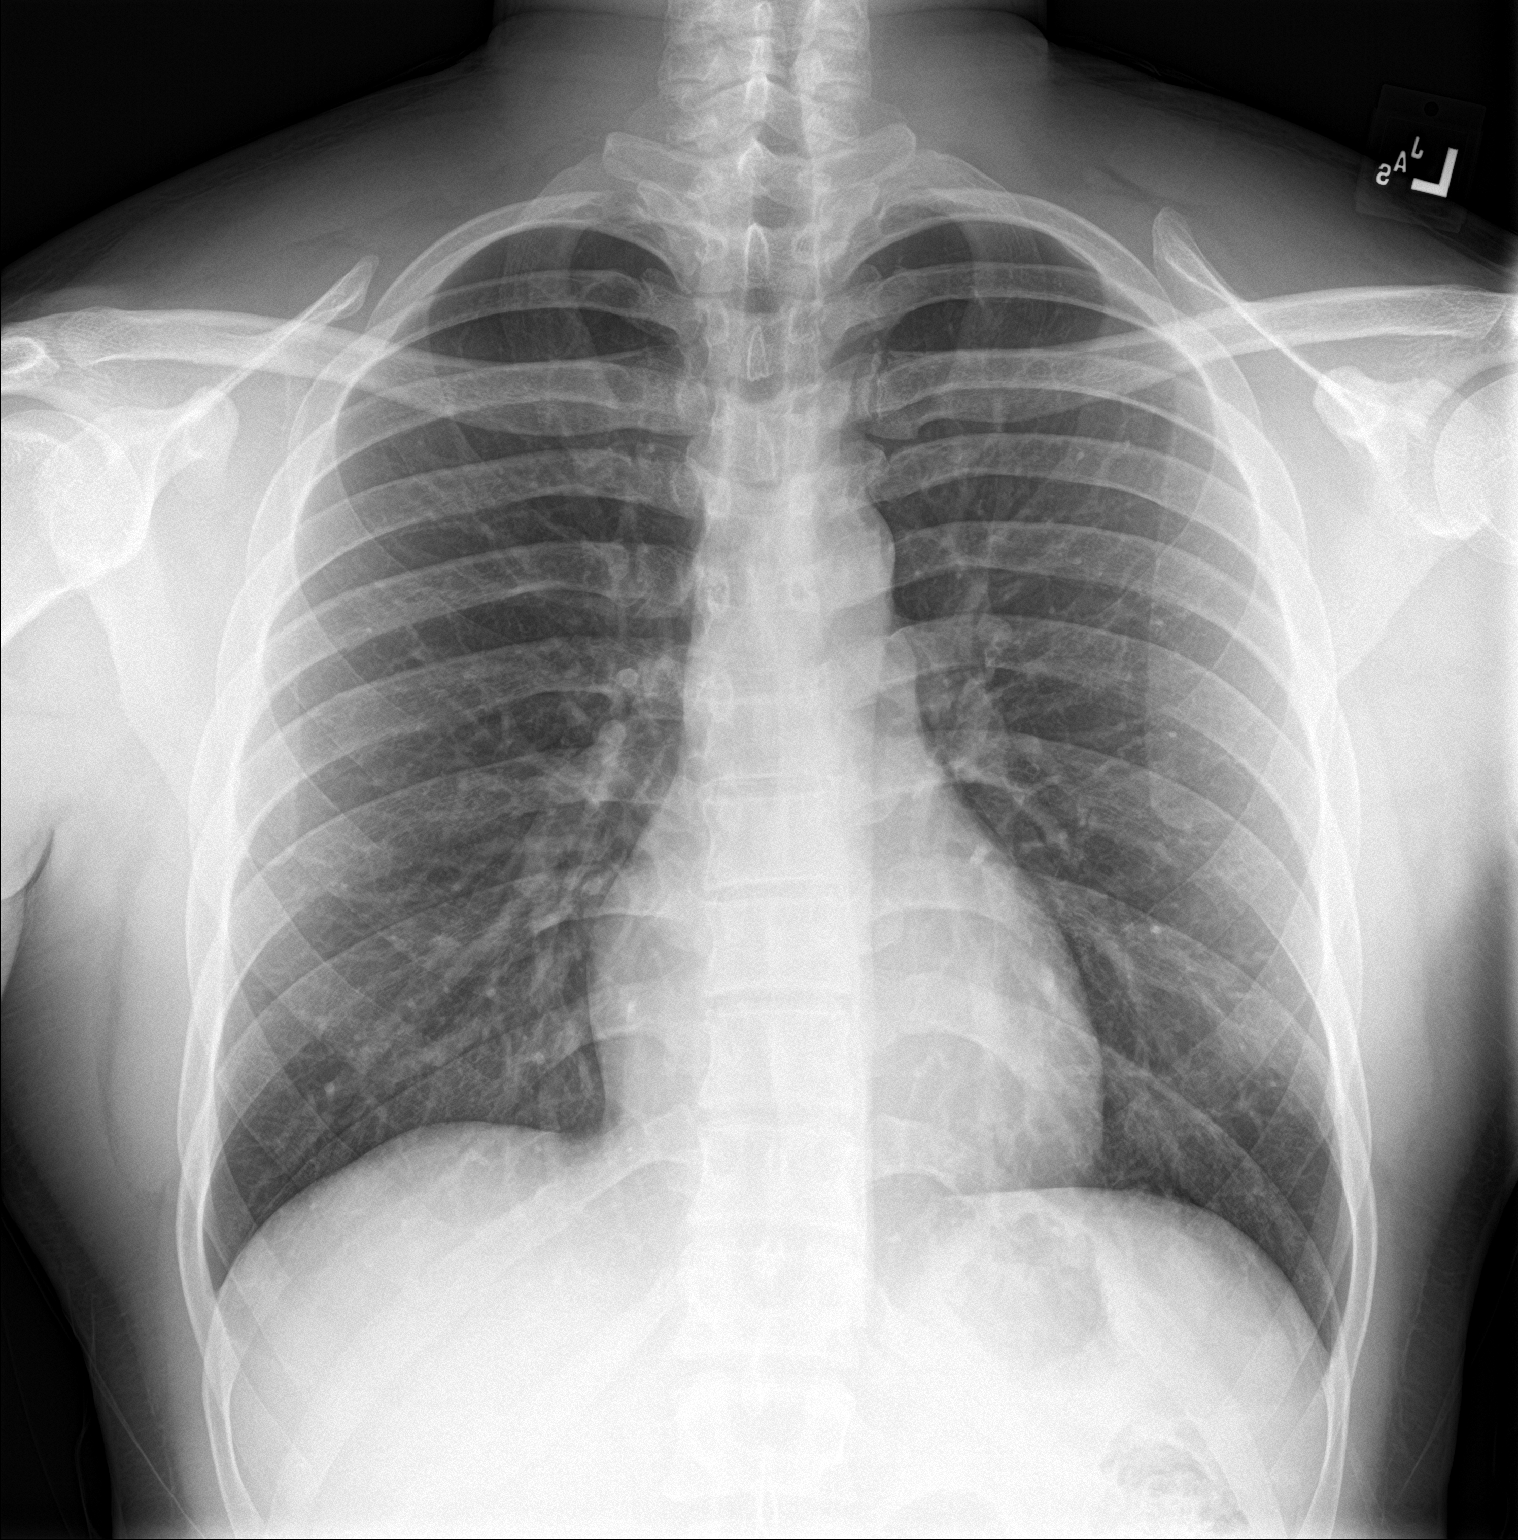

[chest lat]
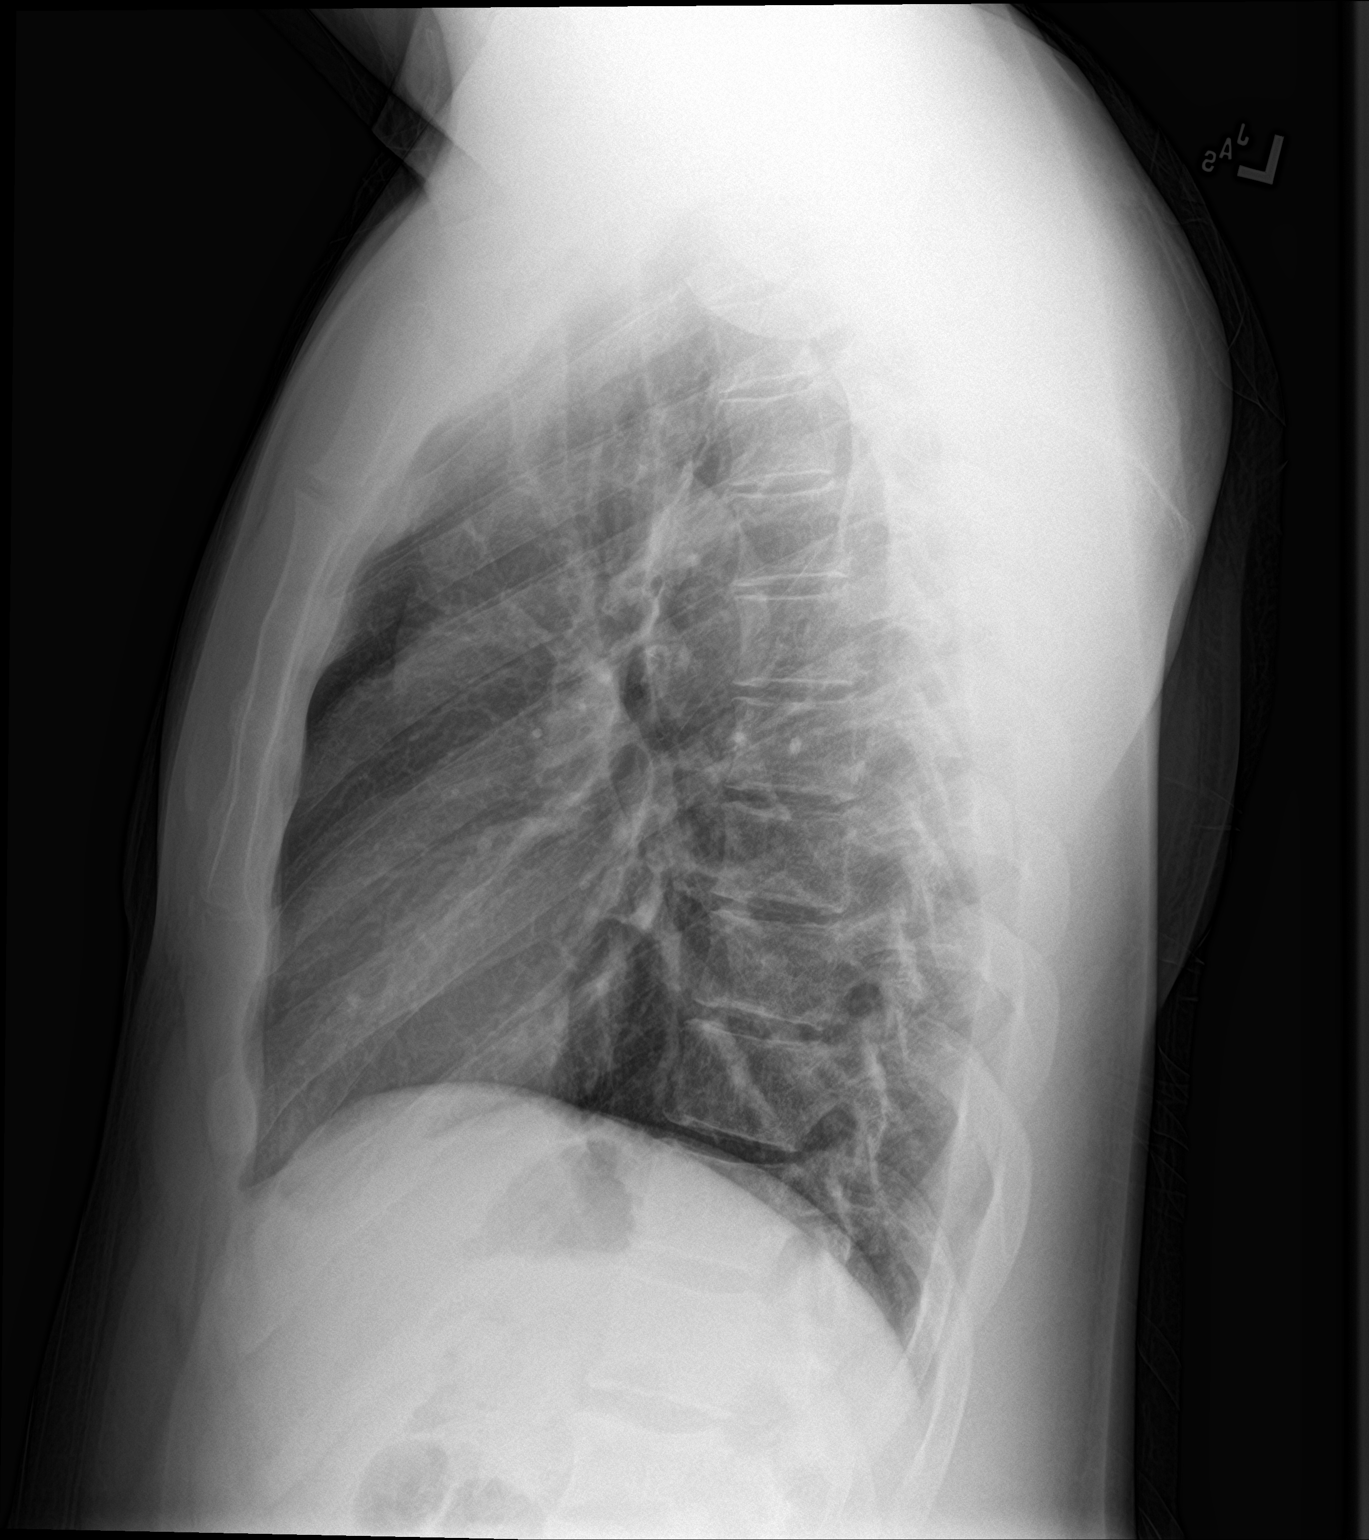

[2 of 2 positions shown; findings below may reference images not displayed]

FINDINGS: The heart size and mediastinal contours are within normal limits.
Both lungs are clear. No pleural effusion or pneumothorax. The
visualized skeletal structures are unremarkable.
IMPRESSION: Normal chest radiographs.

## 2019-10-27 ENCOUNTER — Encounter: Payer: Self-pay | Admitting: Neurology

## 2019-10-27 ENCOUNTER — Ambulatory Visit (INDEPENDENT_AMBULATORY_CARE_PROVIDER_SITE_OTHER): Payer: Managed Care, Other (non HMO) | Admitting: Neurology

## 2019-10-27 ENCOUNTER — Other Ambulatory Visit: Payer: Self-pay

## 2019-10-27 VITALS — BP 128/89 | HR 58 | Ht 72.0 in | Wt 273.0 lb

## 2019-10-27 DIAGNOSIS — R351 Nocturia: Secondary | ICD-10-CM

## 2019-10-27 DIAGNOSIS — R519 Headache, unspecified: Secondary | ICD-10-CM | POA: Diagnosis not present

## 2019-10-27 DIAGNOSIS — R0683 Snoring: Secondary | ICD-10-CM

## 2019-10-27 DIAGNOSIS — E669 Obesity, unspecified: Secondary | ICD-10-CM

## 2019-10-27 DIAGNOSIS — G4719 Other hypersomnia: Secondary | ICD-10-CM | POA: Diagnosis not present

## 2019-10-27 NOTE — Patient Instructions (Signed)

## 2019-10-27 NOTE — Progress Notes (Addendum)
Subjective:    Patient ID: Elenora FenderReginald D Kulak is a 33 y.o. male.  HPI     Huston FoleySaima Athar, MD, PhD [facility] [address], [address] [address] [place] [id number]  Dear Dr. Link SnufferHolwerda, I saw your patient, Marquette SaaReginald Pekar, upon your kind request in my sleep clinic today for initial consultation of his sleep disorder, in particular, concern for underlying obstructive sleep apnea.  The patient is unaccompanied today.  As you know, [name] is a 33 year old right-handed gentleman with an underlying medical history of hypertension, asthma, recurrent headaches, reflux disease, and obesity, who reports snoring and excessive somnolence.  I reviewed your office note from 10/13/2019.  His Epworth sleepiness score is 11 out of 24, fatigue severity score is 48 out of 63.  He does not wake up rested.  He is a third shift worker for years.  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  Bedtime is generally between 6 and 7 AM and he may sleep until 6 PM.  He has fairly frequent urination after going to bed, has to get up 3 times typically and has woken up with a headache.  He lives with his fiance and 33 year old son.  He drinks caffeine in the form of energy drinks about 3-4 times out of the week when he is at work.  He is a non-smoker and drinks alcohol about twice a week.  For his headaches he was given a prescription for Imitrex as needed, which has helped.  His mother has sleep apnea and uses a CPAP machine.  He would consider using a CPAP machine if the need arises.  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  He has a TV in the bedroom and it is sometimes on when he goes to bed, they have no pets in the household.  His Past Medical History Is Significant For: Past Medical History:  Diagnosis Date  . Asthma   . Hypertension     His Past Surgical History Is Significant For: Past Surgical History:  Procedure Laterality Date  . IR  ANGIOGRAM EXTREMITY RIGHT  02/07/2018  . IR US GUIDE VASC ACCESS RIGHT  02/07/2018  . MOUTH SURGERY      His Family History Is Significant For: Family History  Problem Relation Age of Onset  . Heart attack Father   . Heart disease Father   . Hypertension Sister   . Hypertension Mother   . Diabetes Mother     His Social History Is Significant For: Social History   Socioeconomic History  . Marital status: Single    Spouse name: Not on file  . Number of children: Not on file  . Years of education: Not on file  . Highest education level: Not on file  Occupational History  . Not on file  Tobacco Use  . Smoking status: Never Smoker  . Smokeless tobacco: Never Used  Substance and Sexual Activity  . Alcohol use: Yes    Comment: occassional  . Drug use: No  . Sexual activity: Yes  Other Topics Concern  . Not on file  Social History Narrative  . Not on file   Social Determinants of Health   Financial Resource Strain:   . Difficulty of Paying Living Expenses: Not on file  Food Insecurity:   . Worried About Programme researcher, broadcasting/film/videounning Out of Food in the Last Year: Not on file  . Ran Out of Food in the Last Year:  Not on file  Transportation Needs:   . Lack of Transportation (Medical): Not on file  . Lack of Transportation (Non-Medical): Not on file  Physical Activity:   . Days of Exercise per Week: Not on file  . Minutes of Exercise per Session: Not on file  Stress:   . Feeling of Stress : Not on file  Social Connections:   . Frequency of Communication with Friends and Family: Not on file  . Frequency of Social Gatherings with Friends and Family: Not on file  . Attends Religious Services: Not on file  . Active Member of Clubs or Organizations: Not on file  . Attends Archivist Meetings: Not on file  . Marital Status: Not on file    His Allergies Are:  Allergies  Allergen Reactions  . Penicillins Nausea And Vomiting    Has patient had a PCN reaction causing immediate rash,  facial/tongue/throat swelling, SOB or lightheadedness with hypotension:YES Has patient had a PCN reaction causing severe rash involving mucus membranes or skin necrosis: NO Has patient had a PCN reaction that required hospitalization NO Has patient had a PCN reaction occurring within the last 10 years: NO If all of the above answers are "NO", then may proceed with Cephalosporin use.  :   His Current Medications Are:  Outpatient Encounter Medications as of 10/27/2019  Medication Sig  . amLODipine (NORVASC) 5 MG tablet Take 5 mg by mouth daily.  . SUMAtriptan Succinate (IMITREX PO) Take by mouth.  . [DISCONTINUED] benzonatate (TESSALON) 100 MG capsule Take 1 capsule (100 mg total) by mouth every 8 (eight) hours. (Patient not taking: Reported on 02/07/2018)  . [DISCONTINUED] butalbital-acetaminophen-caffeine (FIORICET, ESGIC) 50-325-40 MG tablet Take 1-2 tablets by mouth every 8 (eight) hours as needed for headache. (Patient not taking: Reported on 02/07/2018)  . [DISCONTINUED] cyclobenzaprine (FLEXERIL) 10 MG tablet Take 1 tablet (10 mg total) by mouth 2 (two) times daily as needed for muscle spasms. (Patient not taking: Reported on 02/07/2018)  . [DISCONTINUED] fluticasone (FLONASE) 50 MCG/ACT nasal spray Place 2 sprays into both nostrils daily.  . [DISCONTINUED] hydrocortisone cream 1 % Apply to affected area 2 times daily  . [DISCONTINUED] naproxen (NAPROSYN) 500 MG tablet Take 1 tablet (500 mg total) by mouth 2 (two) times daily.  . [DISCONTINUED] promethazine-dextromethorphan (PROMETHAZINE-DM) 6.25-15 MG/5ML syrup Take 5 mLs by mouth 4 (four) times daily as needed.   No facility-administered encounter medications on file as of 10/27/2019.  :  Review of Systems:  Out of a complete 14 point review of systems, all are reviewed and negative with the exception of these symptoms as listed below: Review of Systems  Neurological:       Pt presents today to discuss his sleep. Pt has never had a  sleep study but does endorse snoring.  Epworth Sleepiness Scale 0= would never doze 1= slight chance of dozing 2= moderate chance of dozing 3= high chance of dozing  Sitting and reading: 3 Watching TV: 2 Sitting inactive in a public place (ex. Theater or meeting): 1 As a passenger in a car for an hour without a break: 0 Lying down to rest in the afternoon: 3 Sitting and talking to someone: 0 Sitting quietly after lunch (no alcohol): 2 In a car, while stopped in traffic: 0 Total: 11     Objective:  Neurological Exam  Physical Exam Physical Examination:   Vitals:   10/27/19 1550  BP: 128/89  Pulse: (!) 58    General Examination: The  patient is a very pleasant 33 y.o. male in no acute distress. He appears well-developed and well-nourished and well groomed.   HEENT: Normocephalic, atraumatic, pupils are equal, round and reactive to light, extraocular tracking is good without limitation to gaze excursion or nystagmus noted. Hearing is grossly intact. Face is symmetric with normal facial animation. Speech is clear with no dysarthria noted. There is no hypophonia. There is no lip, neck/head, jaw or voice tremor. Neck is supple with full range of passive and active motion. There are no carotid bruits on auscultation. Oropharynx exam reveals: mild mouth dryness, adequate dental hygiene and moderate airway crowding, due to Smaller airway entry, tonsils are 1+ bilaterally, longer tongue noted, Mallampati class II, neck circumference is 15-7/8 inches.  He has a moderate overbite.  Tongue protrudes centrally in palate elevates symmetrically.    Chest: Clear to auscultation without wheezing, rhonchi or crackles noted.  Heart: S1+S2+0, regular and normal without murmurs, rubs or gallops noted.   Abdomen: Soft, non-tender and non-distended with normal bowel sounds appreciated on auscultation.  Extremities: There is no pitting edema in the distal lower extremities bilaterally.   Skin:  Warm and dry without trophic changes noted.   Musculoskeletal: exam reveals no obvious joint deformities, tenderness or joint swelling or erythema.   Neurologically:  Mental status: The patient is awake, alert and oriented in all 4 spheres. His immediate and remote memory, attention, language skills and fund of knowledge are appropriate. There is no evidence of aphasia, agnosia, apraxia or anomia. Speech is clear with normal prosody and enunciation. Thought process is linear. Mood is normal and affect is normal.  Cranial nerves II - XII are as described above under HEENT exam.  Motor exam: Normal bulk, strength and tone is noted. There is no tremor, Romberg is negative. Fine motor skills and coordination: grossly intact.  Cerebellar testing: No dysmetria or intention tremor. There is no truncal or gait ataxia.  Sensory exam: intact to light touch in the upper and lower extremities.  Gait, station and balance: He stands easily. No veering to one side is noted. No leaning to one side is noted. Posture is age-appropriate and stance is narrow based. Gait shows normal stride length and normal pace. No problems turning are noted. Tandem walk is unremarkable.                Assessment and Plan:   In summary, BREVYN RING is a very pleasant 33 y.o.-year old male with an underlying medical history of hypertension, asthma, recurrent headaches, reflux disease, and obesity, whose history and physical exam are concerning for obstructive sleep apnea (OSA). I had a long chat with the patient about my findings and the diagnosis of OSA, its prognosis and treatment options. We talked about medical treatments, surgical interventions and non-pharmacological approaches. I explained in particular the risks and ramifications of untreated moderate to severe OSA, especially with respect to developing cardiovascular disease down the Road, including congestive heart failure, difficult to treat hypertension, cardiac  arrhythmias, or stroke. Even type 2 diabetes has, in part, been linked to untreated OSA. Symptoms of untreated OSA include daytime sleepiness, memory problems, mood irritability and mood disorder such as depression and anxiety, lack of energy, as well as recurrent headaches, especially morning headaches. We talked about trying to maintain a healthy lifestyle in general, as well as the importance of weight control. We also talked about the importance of good sleep hygiene. I recommended the following at this time: sleep study.  I explained the sleep test procedure to the patient and also outlined possible surgical and non-surgical treatment options of OSA, including the use of a custom-made dental device (which would require a referral to a specialist dentist or oral surgeon), upper airway surgical options, such as traditional UPPP or a novel less invasive surgical option in the form of Inspire hypoglossal nerve stimulation (which would involve a referral to an ENT surgeon). I also explained the CPAP treatment option to the patient, who indicated that he would be willing to try CPAP if the need arises. I explained the importance of being compliant with PAP treatment, not only for insurance purposes but primarily to improve His symptoms, and for the patient's long term health benefit, including to reduce His cardiovascular risks. I answered all his questions today and the patient was in agreement. I plan to see him back after the sleep study is completed and encouraged him to call with any interim questions, concerns, problems or updates.   Thank you very much for allowing me to participate in the care of this nice patient. If I can be of any further assistance to you please do not hesitate to call me at [phone].  Sincerely,   Star Age, MD, PhD

## 2019-11-17 ENCOUNTER — Ambulatory Visit: Payer: Managed Care, Other (non HMO) | Admitting: Neurology

## 2019-11-17 DIAGNOSIS — R351 Nocturia: Secondary | ICD-10-CM

## 2019-11-17 DIAGNOSIS — R0683 Snoring: Secondary | ICD-10-CM

## 2019-11-17 DIAGNOSIS — G4719 Other hypersomnia: Secondary | ICD-10-CM

## 2019-11-17 DIAGNOSIS — G4733 Obstructive sleep apnea (adult) (pediatric): Secondary | ICD-10-CM

## 2019-11-17 DIAGNOSIS — R519 Headache, unspecified: Secondary | ICD-10-CM

## 2019-11-17 DIAGNOSIS — E669 Obesity, unspecified: Secondary | ICD-10-CM

## 2019-11-20 NOTE — Addendum Note (Signed)
Addended by: Huston Foley on: 11/20/2019 06:37 PM   Modules accepted: Orders

## 2019-11-20 NOTE — Progress Notes (Signed)
Patient referred by Dr. Link Snuffer, seen by me on 10/27/19, HST on 11/17/19.    Please call and notify the patient that the recent home sleep test showed obstructive sleep apnea in the moderate range (by AHI criteria, 27/h, O2 nadir was 90%). While I recommend treatment for this in the form CPAP, his insurance will not approve a sleep study for this. They will likely only approve a trial of autoPAP, which means, that we don't have to bring him in for a sleep study with CPAP, but will let him try an autoPAP machine at home, through a DME company (of his choice, or as per insurance requirement). The DME representative will educate him on how to use the machine, how to put the mask on, etc. I have placed an order in the chart. Please send referral, talk to patient, send report to referring MD. We will need a FU in sleep clinic for 10 weeks post-PAP set up, please arrange that with me or one of our NPs. Thanks,   Huston Foley, MD, PhD [facility])

## 2019-11-20 NOTE — Procedures (Signed)
Patient Information     First Name: Franky Last Name: Phillippe Orlick: [id number]  Birth Date: 1986/08/13 Age: 34 Gender: Male  Referring Provider: Dr. Link Snuffer BMI: 37.0 (W=273 lb, H=6' 0'')  Neck Circ.:  16 '' Epworth:  11/24   Sleep Study Information    Study Date: Nov 17, 2019 S/H/A Version: [id number] / 4.1.1528 / 68  History:    34 year old man with a history of hypertension, asthma, recurrent headaches, reflux disease, and obesity, who reports snoring and excessive somnolence. He is a third shift worker for years.  Summary & Diagnosis:     OSA  Recommendations:     This home sleep test demonstrates moderate obstructive sleep apnea by number of events, with a total AHI of 27/hour and O2 nadir of 90%. Given the patient's medical history and sleep related complaints, treatment with positive airway pressure (in the form of CPAP) is recommended. This will require, ideally, a full night CPAP titration study for proper treatment settings, O2 monitoring and mask fitting. Based on the severity of the sleep disordered breathing an attended titration study is indicated. However, patient's insurance has denied an attended sleep study; therefore, the patient will be advised to proceed with an autoPAP titration/trial at home for now. Please note that untreated obstructive sleep apnea may carry additional perioperative morbidity. Patients with significant obstructive sleep apnea should receive perioperative PAP therapy and the surgeons and particularly the anesthesiologist should be informed of the diagnosis and the severity of the sleep disordered breathing. The patient should be cautioned not to drive, work at heights, or operate dangerous or heavy equipment when tired or sleepy. Review and reiteration of good sleep hygiene measures should be pursued with any patient. Other causes of the patient's symptoms, including circadian rhythm disturbances, an underlying mood disorder, medication effect and/or an  underlying medical problem cannot be ruled out based on this test. Clinical correlation is recommended. The patient and his referring provider will be notified of the test results. The patient will be seen in follow up in sleep clinic at [facility].  I certify that I have reviewed the raw data recording prior to the issuance of this report in accordance with the standards of the American Academy of Sleep Medicine (AASM).  Huston Foley, MD, PhD [facility]) Diplomat, ABPN (Neurology and Sleep)                   Start Study Time: End Study Time: Total Recording Time:  8:41:30 AM 4:07:57 PM 7 h, 26 min  Total Sleep Time % REM of Sleep Time:  6 h, 20 min  28.3    Mean: 95 Minimum: 90 Maximum: 100  Mean of Desaturations Nadirs (%):   93  Oxygen Desaturation. %: 4-9 10-20 >20 Total  Events Number Total  52 100.0  0 0.0  0 0.0  52 100.0  Oxygen Saturation: <90 <=88 <85 <80 <70  Duration (minutes): Sleep % 0.0 0.0 0.0 0.0 0.0 0.0 0.0 0.0 0.0 0.0     Respiratory Indices       Total Events REM NREM All Night  pRDI: pAHI: ODI:  154  143  52 36.6 32.0 14.5 26.7 25.4 8.3 29.1 27.0 9.8       Pulse Rate Statistics during Sleep (BPM)      Mean: 65 Minimum: 38 Maximum: 105    Sleep Summary  Oxygen Saturation Statistics   Indices are calculated using technically valid sleep time of  5 h, 17 min. pRDI/pAHI are calculated using oxi desaturations ? 3%                 Sleep Stages Chart                                         pAHI=27.0                                                     Mild              Moderate                    Severe                                                 5              15                    30

## 2019-11-21 ENCOUNTER — Telehealth: Payer: Self-pay | Admitting: Neurology

## 2019-11-21 NOTE — Telephone Encounter (Signed)
Called patient to discuss sleep study results. No answer at this time. LVM for the patient to call back.   

## 2019-11-21 NOTE — Telephone Encounter (Signed)
-----   Message from Huston Foley, MD sent at 11/20/2019  6:37 PM EST ----- Patient referred by Dr. Link Snuffer, seen by me on 10/27/19, HST on 11/17/19.    Please call and notify the patient that the recent home sleep test showed obstructive sleep apnea in the moderate range (by AHI criteria, 27/h, O2 nadir was 90%). While I recommend treatment for this in the form CPAP, his insurance will not approve a sleep study for this. They will likely only approve a trial of autoPAP, which means, that we don't have to bring him in for a sleep study with CPAP, but will let him try an autoPAP machine at home, through a DME company (of his choice, or as per insurance requirement). The DME representative will educate him on how to use the machine, how to put the mask on, etc. I have placed an order in the chart. Please send referral, talk to patient, send report to referring MD. We will need a FU in sleep clinic for 10 weeks post-PAP set up, please arrange that with me or one of our NPs. Thanks,   Huston Foley, MD, PhD [facility])

## 2019-11-24 NOTE — Telephone Encounter (Signed)
Pt returned call for sleep results

## 2019-11-25 ENCOUNTER — Encounter: Payer: Self-pay | Admitting: Neurology

## 2019-11-25 NOTE — Telephone Encounter (Signed)
I called pt. I advised pt that Dr. Frances Furbish reviewed their sleep study results and found that pt has moderate. Dr. Frances Furbish recommends that pt starts auto CPAP. I reviewed PAP compliance expectations with the pt. Pt is agreeable to starting a CPAP. I advised pt that an order will be sent to a DME, [facility], and [facility] will call the pt within about one week after they file with the pt's insurance. [facility] will show the pt how to use the machine, fit for masks, and troubleshoot the CPAP if needed. A follow up appt was made for insurance purposes with Dr. Frances Furbish on Feb 9,2021 at 8:30 am. Pt verbalized understanding to arrive 15 minutes early and bring their CPAP. A letter with all of this information in it will be mailed to the pt as a reminder. I verified with the pt that the address we have on file is correct. Pt verbalized understanding of results. Pt had no questions at this time but was encouraged to call back if questions arise. I have sent the order to [facility] and have received confirmation that they have received the order.

## 2019-12-23 ENCOUNTER — Ambulatory Visit: Payer: Self-pay | Admitting: Neurology

## 2020-01-19 ENCOUNTER — Telehealth: Payer: Self-pay

## 2020-01-19 NOTE — Telephone Encounter (Signed)
I attempted to reach the pt 3 times to discuss his 01/20/2020 appt. Pt was started on his CPAP on 12/24/2019. Due to this start date we need to push the initial cpap f/u out. This appointment needs to be between 3/11 and 5/10.  Pt was advised to call back so we can reschedule.

## 2020-01-20 ENCOUNTER — Ambulatory Visit: Payer: Self-pay | Admitting: Neurology

## 2020-01-29 ENCOUNTER — Ambulatory Visit: Payer: Managed Care, Other (non HMO) | Admitting: Neurology

## 2020-01-29 ENCOUNTER — Encounter: Payer: Self-pay | Admitting: Neurology

## 2020-01-29 ENCOUNTER — Other Ambulatory Visit: Payer: Self-pay

## 2020-01-29 VITALS — BP 127/82 | HR 58 | Temp 97.1°F | Ht 72.0 in | Wt 274.0 lb

## 2020-01-29 DIAGNOSIS — Z9989 Dependence on other enabling machines and devices: Secondary | ICD-10-CM

## 2020-01-29 DIAGNOSIS — G4733 Obstructive sleep apnea (adult) (pediatric): Secondary | ICD-10-CM | POA: Diagnosis not present

## 2020-01-29 NOTE — Patient Instructions (Signed)
Please continue using your autoPAP regularly. While your insurance requires that you use PAP at least 4 hours each night on 70% of the nights, I recommend, that you not skip any nights and use it throughout the night if you can. Getting used to PAP and staying with the treatment long term does take time and patience and discipline. Untreated obstructive sleep apnea when it is moderate to severe can have an adverse impact on cardiovascular health and raise her risk for heart disease, arrhythmias, hypertension, congestive heart failure, stroke and diabetes. Untreated obstructive sleep apnea causes sleep disruption, nonrestorative sleep, and sleep deprivation. This can have an impact on your day to day functioning and cause daytime sleepiness and impairment of cognitive function, memory loss, mood disturbance, and problems focussing. Using PAP regularly can improve these symptoms.  I am glad you are feeling better with regards to your headaches.  Please try to be consistent with your AutoPap, you are not quite there when it comes to insurance mandated compliance of 70% of the time using it more than 4 hours, please try not to skip any nights.  Please be really mindful that you are likely sleepy during the day as you work nights.  Since you have a poor driving and over on some days, please do not drive when you feel sleepy.  Try to get about 7 hours of sleep if you can.  Your average usage at best was about 5-1/2 hours, I would like to see more usage if possible see you reap the most benefit from treatment.

## 2020-01-29 NOTE — Progress Notes (Signed)
Subjective:    Patient ID: Rodney Nunez is a 34 y.o. male.  HPI     Interim history:   Rodney Nunez is a 34 year old right-handed gentleman with an underlying medical history of hypertension, asthma, recurrent headaches, reflux disease, and obesity, who  Presents for follow-up consultation of his obstructive sleep apnea and recurrent headaches after interim testing and starting AutoPap therapy.  The patient is unaccompanied today.  I first met him on 10/27/2019 at the request of his primary care physician, at which time the patient reported snoring and somnolence, he is a third shift worker.  He had a home sleep test on 11/17/2019 which indicated moderate obstructive sleep apnea by number of events with a total AHI of 27/h, O2 nadir was 90%.  He was advised to start AutoPap therapy.  Today, 01/29/2020: I reviewed his AutoPap compliance data from 12/29/2019 through 01/27/2020 which is a total of 30 days, during which time he used his machine 26 days with percent use days greater than 4 hours at 57%, indicating mildly suboptimal compliance, average usage of 5 hours and 7 minutes, residual AHI at goal at 1.1/h, average pressure of 10 cm, leak on the low side with a 95th percentile at 9 L/min on a pressure range of 6 to 12 cm.  His best compliance was when he first got the machine, set up date was 12/24/2019 and his compliance for more than 4 hours was 67%, still slightly suboptimal.  He reports feeling much improved, particularly with regards to his headaches.  He continues to work third shift, typically from 8 PM to 6:30 AM on Sundays, Mondays, Thursdays, and Fridays.  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  He reports that he does not do this very frequently and is mindful that he should not drive when sleepy.  He had some trouble with his initial mask which accounts for some of the lower use it in the beginning, he got a different style of full facemask and likes it better.  He is  motivated to continue with treatment and increase his usage.  The patient's allergies, current medications, family history, past medical history, past social history, past surgical history and problem list were reviewed and updated as appropriate.   Previously:  10/27/2019: (He) reports snoring and excessive somnolence.  I reviewed your office note from 10/13/2019.  His Epworth sleepiness score is 11 out of 24, fatigue severity score is 48 out of 63.  He does not wake up rested.  He is a third shift worker for years.  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  Bedtime is generally between 6 and 7 AM and he may sleep until 6 PM.  He has fairly frequent urination after going to bed, has to get up 3 times typically and has woken up with a headache.  He lives with his fiance and 65 year old son.  He drinks caffeine in the form of energy drinks about 3-4 times out of the week when he is at work.  He is a non-smoker and drinks alcohol about twice a week.  For his headaches he was given a prescription for Imitrex as needed, which has helped.  His mother has sleep apnea and uses a CPAP machine.  He would consider using a CPAP machine if the need arises.  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  He has a TV in the bedroom and it  is sometimes on when he goes to bed, they have no pets in the household.  His Past Medical History Is Significant For: Past Medical History:  Diagnosis Date  . Asthma   . Hypertension     His Past Surgical History Is Significant For: Past Surgical History:  Procedure Laterality Date  . IR ANGIOGRAM EXTREMITY RIGHT  02/07/2018  . IR US GUIDE VASC ACCESS RIGHT  02/07/2018  . MOUTH SURGERY      His Family History Is Significant For: Family History  Problem Relation Age of Onset  . Heart attack Father   . Heart disease Father   . Hypertension Sister   . Hypertension Mother   . Diabetes Mother     His Social History Is Significant  For: Social History   Socioeconomic History  . Marital status: Single    Spouse name: Not on file  . Number of children: Not on file  . Years of education: Not on file  . Highest education level: Not on file  Occupational History  . Not on file  Tobacco Use  . Smoking status: Never Smoker  . Smokeless tobacco: Never Used  Substance and Sexual Activity  . Alcohol use: Yes    Comment: occassional  . Drug use: No  . Sexual activity: Yes  Other Topics Concern  . Not on file  Social History Narrative  . Not on file   Social Determinants of Health   Financial Resource Strain:   . Difficulty of Paying Living Expenses:   Food Insecurity:   . Worried About Charity fundraiser in the Last Year:   . Arboriculturist in the Last Year:   Transportation Needs:   . Film/video editor (Medical):   Marland Kitchen Lack of Transportation (Non-Medical):   Physical Activity:   . Days of Exercise per Week:   . Minutes of Exercise per Session:   Stress:   . Feeling of Stress :   Social Connections:   . Frequency of Communication with Friends and Family:   . Frequency of Social Gatherings with Friends and Family:   . Attends Religious Services:   . Active Member of Clubs or Organizations:   . Attends Archivist Meetings:   Marland Kitchen Marital Status:     His Allergies Are:  Allergies  Allergen Reactions  . Penicillins Nausea And Vomiting    Has patient had a PCN reaction causing immediate rash, facial/tongue/throat swelling, SOB or lightheadedness with hypotension:YES Has patient had a PCN reaction causing severe rash involving mucus membranes or skin necrosis: NO Has patient had a PCN reaction that required hospitalization NO Has patient had a PCN reaction occurring within the last 10 years: NO If all of the above answers are "NO", then may proceed with Cephalosporin use.  :   His Current Medications Are:  Outpatient Encounter Medications as of 01/29/2020  Medication Sig  . amLODipine  (NORVASC) 5 MG tablet Take 5 mg by mouth daily.  . SUMAtriptan Succinate (IMITREX PO) Take by mouth.   No facility-administered encounter medications on file as of 01/29/2020.  :  Review of Systems:  Out of a complete 14 point review of systems, all are reviewed and negative with the exception of these symptoms as listed below: Review of Systems  Neurological:       Pt states he feels better and notice improvements, tries to use as much as he can.     Objective:  Neurological Exam  Physical Exam Physical  Examination:   Vitals:   01/29/20 0921  BP: 127/82  Pulse: (!) 58  Temp: (!) 97.1 F (36.2 C)    General Examination: The patient is a very pleasant 34 y.o. male in no acute distress. He appears well-developed and well-nourished and well groomed.   HEENT: Normocephalic, atraumatic, pupils are equal, round and reactive to light, extraocular tracking is good without limitation to gaze excursion or nystagmus noted. Hearing is grossly intact. Face is symmetric with normal facial animation. Speech is clear with no dysarthria noted. There is no hypophonia. There is no lip, neck/head, jaw or voice tremor. Neck is supple with full range of passive and active motion. There are no carotid bruits on auscultation. Oropharynx exam reveals: mild mouth dryness, adequate dental hygiene and moderate airway crowding.  Tongue protrudes centrally in palate elevates symmetrically.    Chest: Clear to auscultation without wheezing, rhonchi or crackles noted.  Heart: S1+S2+0, regular and normal without murmurs, rubs or gallops noted.   Abdomen: Soft, non-tender and non-distended with normal bowel sounds appreciated on auscultation.  Extremities: There is no pitting edema in the distal lower extremities bilaterally.   Skin: Warm and dry without trophic changes noted.   Musculoskeletal: exam reveals no obvious joint deformities, tenderness or joint swelling or erythema.   Neurologically:   Mental status: The patient is awake, alert and oriented in all 4 spheres. His immediate and remote memory, attention, language skills and fund of knowledge are appropriate. There is no evidence of aphasia, agnosia, apraxia or anomia. Speech is clear with normal prosody and enunciation. Thought process is linear. Mood is normal and affect is normal.  Cranial nerves II - XII are as described above under HEENT exam.  Motor exam: Normal bulk, strength and tone is noted. There is no tremor, Romberg is negative. Fine motor skills and coordination: grossly intact.  Cerebellar testing: No dysmetria or intention tremor. There is no truncal or gait ataxia.  Sensory exam: intact to light touch in the upper and lower extremities.  Gait, station and balance: He stands easily. No veering to one side is noted. No leaning to one side is noted. Posture is age-appropriate and stance is narrow based. Gait shows normal stride length and normal pace. No problems turning are noted. Tandem walk is unremarkable.                Assessment and Plan:   In summary, Rodney Nunez is a very pleasant 34 year old male with an underlying medical history of hypertension, asthma, recurrent headaches, reflux disease, and obesity, who presents for follow-up consultation of his obstructive sleep apnea and recurrent headaches, after interim home sleep testing and starting AutoPap therapy.  He had his best compliance when he first got the machine in the beginning of February 2021.  He has had some lapses in treatments, reports that shiftwork makes it hard at times for him to sleep consistently during the day.  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  He is mindful of this and is strongly advised not to drive when feeling sleepy.  He is encouraged to be more consistent with his AutoPap usage as he has benefited from it.  He feels that his headaches are much improved.   He is motivated to continue with treatment.  He is advised to try to increase his total usage time on an average night and also not to skip any nights.  He  is advised to follow-up with one of our nurse practitioners in about 3 months and if he does well, he can be followed on a more infrequent basis in our clinic.  I answered all his questions today and he was in agreement.   I spent 20 minutes in total face-to-face time and in reviewing records during pre-charting, more than 50% of which was spent in counseling and coordination of care, reviewing test results, reviewing medications and treatment regimen and/or in discussing or reviewing the diagnosis of OSA, the prognosis and treatment options. Pertinent laboratory and imaging test results that were available during this visit with the patient were reviewed by me and considered in my medical decision making (see chart for details).

## 2020-01-31 ENCOUNTER — Telehealth: Payer: 59 | Admitting: Nurse Practitioner

## 2020-01-31 DIAGNOSIS — G43009 Migraine without aura, not intractable, without status migrainosus: Secondary | ICD-10-CM

## 2020-01-31 NOTE — Progress Notes (Signed)
Based on what you shared with me it looks like you have migraine ,that should be evaluated in a face to face office visit. Spoke with patient on phone and has a prescription for imitrex to be picked up at hs pharmacy. All he needed from [place] was a note for work today.    NOTE: If you entered your credit card information for this eVisit, you will not be charged. You may see a "hold" on your card for the $35 but that hold will drop off and you will not have a charge processed.  If you are having a true medical emergency please call 911.     For an urgent face to face visit, [facility] has four urgent care centers for your convenience:   . [facility]    [phone]                  Get Driving Directions  [address] [place], [place] [address] . 10 am to 8 pm Monday-Friday . 12 pm to 8 pm Saturday-Sunday   . [facility] at [facility]  [phone]                  Get Driving Directions  [address] [place], [place] [address] . 8 am to 8 pm Monday-Friday . 9 am to 6 pm Saturday . 11 am to 6 pm Sunday   . [facility] at [facility]  [phone]                  Get Driving Directions   [address] [place], [place] [address] . 8 am to 8 pm Monday-Friday . 8 am to 4 pm Saturday-Sunday    . [facility]gent [facility] at [facility] Directions  [phone]  [address] [place], [place] [address]  . Monday-Friday, 12 PM to 6 PM    Your e-visit answers were reviewed by a board certified advanced clinical practitioner to complete your personal care plan.  Thank you for using e-Visits.  5-10 minutes spent reviewing and documenting in chart.

## 2020-03-05 ENCOUNTER — Telehealth: Payer: 59 | Admitting: Nurse Practitioner

## 2020-03-05 DIAGNOSIS — R519 Headache, unspecified: Secondary | ICD-10-CM

## 2020-03-05 NOTE — Progress Notes (Signed)
Based on what you shared with me, I feel your condition warrants further evaluation and I recommend that you be seen for a face to face visit.  Please contact your primary care physician practice to be seen. Many offices offer virtual options to be seen via video if you are not comfortable going in person to a medical facility at this time.  If you do not have a PCP,  offers a free physician referral service available at [phone]. Our trained staff has the experience, knowledge and resources to put you in touch with a physician who is right for you.   You also have the option of a video visit through [email or web address]  If you are having a true medical emergency please call 911.  NOTE: If you entered your credit card information for this eVisit, you will not be charged. You may see a "hold" on your card for the $35 but that hold will drop off and you will not have a charge processed.  Your e-visit answers were reviewed by a board certified advanced clinical practitioner to complete your personal care plan.  Thank you for using e-Visits.  I will write doctors note for work today. It will be in your my chart

## 2020-05-03 ENCOUNTER — Ambulatory Visit: Payer: Managed Care, Other (non HMO) | Admitting: Family Medicine

## 2020-05-12 ENCOUNTER — Encounter: Payer: Self-pay | Admitting: Family Medicine

## 2020-05-12 ENCOUNTER — Ambulatory Visit: Payer: Managed Care, Other (non HMO) | Admitting: Family Medicine

## 2020-05-12 VITALS — BP 124/68 | HR 70 | Ht 72.0 in | Wt 276.9 lb

## 2020-05-12 DIAGNOSIS — R519 Headache, unspecified: Secondary | ICD-10-CM

## 2020-05-12 DIAGNOSIS — Z9989 Dependence on other enabling machines and devices: Secondary | ICD-10-CM

## 2020-05-12 DIAGNOSIS — G4733 Obstructive sleep apnea (adult) (pediatric): Secondary | ICD-10-CM | POA: Diagnosis not present

## 2020-05-12 NOTE — Patient Instructions (Signed)
Please continue using your CPAP regularly. While your insurance requires that you use CPAP at least 4 hours each night on 70% of the nights, I recommend, that you not skip any nights and use it throughout the night if you can. Getting used to CPAP and staying with the treatment long term does take time and patience and discipline. Untreated obstructive sleep apnea when it is moderate to severe can have an adverse impact on cardiovascular health and raise her risk for heart disease, arrhythmias, hypertension, congestive heart failure, stroke and diabetes. Untreated obstructive sleep apnea causes sleep disruption, nonrestorative sleep, and sleep deprivation. This can have an impact on your day to day functioning and cause daytime sleepiness and impairment of cognitive function, memory loss, mood disturbance, and problems focussing. Using CPAP regularly can improve these symptoms.   Follow up in 3 months   Sleep Apnea Sleep apnea affects breathing during sleep. It causes breathing to stop for a short time or to become shallow. It can also increase the risk of:  Heart attack.  Stroke.  Being very overweight (obese).  Diabetes.  Heart failure.  Irregular heartbeat. The goal of treatment is to help you breathe normally again. What are the causes? There are three kinds of sleep apnea:  Obstructive sleep apnea. This is caused by a blocked or collapsed airway.  Central sleep apnea. This happens when the brain does not send the right signals to the muscles that control breathing.  Mixed sleep apnea. This is a combination of obstructive and central sleep apnea. The most common cause of this condition is a collapsed or blocked airway. This can happen if:  Your throat muscles are too relaxed.  Your tongue and tonsils are too large.  You are overweight.  Your airway is too small. What increases the risk?  Being overweight.  Smoking.  Having a small airway.  Being older.  Being  male.  Drinking alcohol.  Taking medicines to calm yourself (sedatives or tranquilizers).  Having family members with the condition. What are the signs or symptoms?  Trouble staying asleep.  Being sleepy or tired during the day.  Getting angry a lot.  Loud snoring.  Headaches in the morning.  Not being able to focus your mind (concentrate).  Forgetting things.  Less interest in sex.  Mood swings.  Personality changes.  Feelings of sadness (depression).  Waking up a lot during the night to pee (urinate).  Dry mouth.  Sore throat. How is this diagnosed?  Your medical history.  A physical exam.  A test that is done when you are sleeping (sleep study). The test is most often done in a sleep lab but may also be done at home. How is this treated?   Sleeping on your side.  Using a medicine to get rid of mucus in your nose (decongestant).  Avoiding the use of alcohol, medicines to help you relax, or certain pain medicines (narcotics).  Losing weight, if needed.  Changing your diet.  Not smoking.  Using a machine to open your airway while you sleep, such as: ? An oral appliance. This is a mouthpiece that shifts your lower jaw forward. ? A CPAP device. This device blows air through a mask when you breathe out (exhale). ? An EPAP device. This has valves that you put in each nostril. ? A BPAP device. This device blows air through a mask when you breathe in (inhale) and breathe out.  Having surgery if other treatments do not work. It is   important to get treatment for sleep apnea. Without treatment, it can lead to:  High blood pressure.  Coronary artery disease.  In men, not being able to have an erection (impotence).  Reduced thinking ability. Follow these instructions at home: Lifestyle  Make changes that your doctor recommends.  Eat a healthy diet.  Lose weight if needed.  Avoid alcohol, medicines to help you relax, and some pain  medicines.  Do not use any products that contain nicotine or tobacco, such as cigarettes, e-cigarettes, and chewing tobacco. If you need help quitting, ask your doctor. General instructions  Take over-the-counter and prescription medicines only as told by your doctor.  If you were given a machine to use while you sleep, use it only as told by your doctor.  If you are having surgery, make sure to tell your doctor you have sleep apnea. You may need to bring your device with you.  Keep all follow-up visits as told by your doctor. This is important. Contact a doctor if:  The machine that you were given to use during sleep bothers you or does not seem to be working.  You do not get better.  You get worse. Get help right away if:  Your chest hurts.  You have trouble breathing in enough air.  You have an uncomfortable feeling in your back, arms, or stomach.  You have trouble talking.  One side of your body feels weak.  A part of your face is hanging down. These symptoms may be an emergency. Do not wait to see if the symptoms will go away. Get medical help right away. Call your local emergency services (911 in the [place] Do not drive yourself to the hospital. Summary  This condition affects breathing during sleep.  The most common cause is a collapsed or blocked airway.  The goal of treatment is to help you breathe normally while you sleep. This information is not intended to replace advice given to you by your health care provider. Make sure you discuss any questions you have with your health care provider. Document Revised: 08/16/2018 Document Reviewed: 06/25/2018 [employer]vier Patient Education  2020 [employer]  

## 2020-05-12 NOTE — Progress Notes (Addendum)
PATIENT: Rodney Nunez DOB: 29-May-1986  REASON FOR VISIT: follow up HISTORY FROM: patient  Chief Complaint  Patient presents with  . Follow-up    3 month f/u for OSA. States he has been using his machine with no issues. Did forget his machine for a week due to vacation. Denies any current issues.   . Room 9    alone     HISTORY OF PRESENT ILLNESS: Today 05/12/20 Rodney Nunez is a 34 y.o. male here today for follow up for OSA on CPAP. He was last seen in 01/2020. He has had difficulty with compliance due to working third shift. He feels that he is doing better. He does note improvement in sleep quality and feels more energized when he can use CPAP > 4 hours. He does mention going on vacation and forgetting his machine. He is feeling well today and without complaints.   Compliance report dated 04/12/2020 through 05/12/2019 where he used CPAP 20 in the past 30 days for compliance of 67%.  4-hour usage at 47% compliance.  Review of 90-day compliance dated 02/12/2020 through 05/11/2020 reveals 83% compliance with daily usage and 60% compliance with 4-hour usage.  Residual AHI is 1.1 on 6 to 12 cm of water and an EPR of 3.  There was no leak noted.    HISTORY: (copied from Dr Guadelupe Sabin note on 01/29/2020)  Rodney Nunez is a 34 year old right-handed gentleman with an underlying medical history of hypertension, asthma, recurrent headaches, reflux disease, and obesity, who  Presents for follow-up consultation of his obstructive sleep apnea and recurrent headaches after interim testing and starting AutoPap therapy.  The patient is unaccompanied today.  I first met him on 10/27/2019 at the request of his primary care physician, at which time the patient reported snoring and somnolence, he is a third shift worker.  He had a home sleep test on 11/17/2019 which indicated moderate obstructive sleep apnea by number of events with a total AHI of 27/h, O2 nadir was 90%.  He was advised to start AutoPap  therapy.  Today, 01/29/2020: I reviewed his AutoPap compliance data from 12/29/2019 through 01/27/2020 which is a total of 30 days, during which time he used his machine 26 days with percent use days greater than 4 hours at 57%, indicating mildly suboptimal compliance, average usage of 5 hours and 7 minutes, residual AHI at goal at 1.1/h, average pressure of 10 cm, leak on the low side with a 95th percentile at 9 L/min on a pressure range of 6 to 12 cm.  His best compliance was when he first got the machine, set up date was 12/24/2019 and his compliance for more than 4 hours was 67%, still slightly suboptimal.  He reports feeling much improved, particularly with regards to his headaches.  He continues to work third shift, typically from 8 PM to 6:30 AM on Sundays, Mondays, Thursdays, and Fridays.  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  He reports that he does not do this very frequently and is mindful that he should not drive when sleepy.  He had some trouble with his initial mask which accounts for some of the lower use it in the beginning, he got a different style of full facemask and likes it better.  He is motivated to continue with treatment and increase his usage.  The patient's allergies, current medications, family history, past medical history, past social history, past surgical history and problem list  were reviewed and updated as appropriate.   Previously:  10/27/2019: (He) reports snoring and excessive somnolence. I reviewed your office note from 10/13/2019. His Epworth sleepiness score is 11 out of 24, fatigue severity score is 48 out of 63. He does not wake up rested. He is a third shift worker for years. 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 Bedtime is generally between 6 and 7 AM and he may sleep until 6 PM. He has fairly frequent urination after going to bed, has to get up 3 times typically and has woken up with a headache. He lives with his fiance and 78 year old  son. He drinks caffeine in the form of energy drinks about 3-4 times out of the week when he is at work. He is a non-smoker and drinks alcohol about twice a week. For his headaches he was given a prescription for Imitrex as needed, which has helped. His mother has sleep apnea and uses a CPAP machine. He would consider using a CPAP machine if the need arises. 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 He has a TV in the bedroom and it is sometimes on when he goes to bed, they have no pets in the household.    REVIEW OF SYSTEMS: Out of a complete 14 system review of symptoms, the patient complains only of the following symptoms, fatigue and all other reviewed systems are negative.  ESS:8 FSS: 30  ALLERGIES: Allergies  Allergen Reactions  . Penicillins Nausea And Vomiting    Has patient had a PCN reaction causing immediate rash, facial/tongue/throat swelling, SOB or lightheadedness with hypotension:YES Has patient had a PCN reaction causing severe rash involving mucus membranes or skin necrosis: NO Has patient had a PCN reaction that required hospitalization NO Has patient had a PCN reaction occurring within the last 10 years: NO If all of the above answers are "NO", then may proceed with Cephalosporin use.    HOME MEDICATIONS: Outpatient Medications Prior to Visit  Medication Sig Dispense Refill  . amLODipine (NORVASC) 5 MG tablet Take 5 mg by mouth daily.    . SUMAtriptan Succinate (IMITREX PO) Take by mouth.     No facility-administered medications prior to visit.    PAST MEDICAL HISTORY: Past Medical History:  Diagnosis Date  . Asthma   . Hypertension     PAST SURGICAL HISTORY: Past Surgical History:  Procedure Laterality Date  . IR ANGIOGRAM EXTREMITY RIGHT  02/07/2018  . IR US GUIDE VASC ACCESS RIGHT  02/07/2018  . MOUTH SURGERY      FAMILY HISTORY: Family History  Problem Relation Age of Onset  . Heart attack  Father   . Heart disease Father   . Hypertension Sister   . Hypertension Mother   . Diabetes Mother     SOCIAL HISTORY: Social History   Socioeconomic History  . Marital status: Single    Spouse name: Not on file  . Number of children: Not on file  . Years of education: Not on file  . Highest education level: Not on file  Occupational History  . Not on file  Tobacco Use  . Smoking status: Never Smoker  . Smokeless tobacco: Never Used  Substance and Sexual Activity  . Alcohol use: Yes    Comment: occassional  . Drug use: No  . Sexual activity: Yes  Other Topics Concern  . Not on file  Social History Narrative  . Not on file   Social  Determinants of Health   Financial Resource Strain:   . Difficulty of Paying Living Expenses:   Food Insecurity:   . Worried About Charity fundraiser in the Last Year:   . Arboriculturist in the Last Year:   Transportation Needs:   . Film/video editor (Medical):   Marland Kitchen Lack of Transportation (Non-Medical):   Physical Activity:   . Days of Exercise per Week:   . Minutes of Exercise per Session:   Stress:   . Feeling of Stress :   Social Connections:   . Frequency of Communication with Friends and Family:   . Frequency of Social Gatherings with Friends and Family:   . Attends Religious Services:   . Active Member of Clubs or Organizations:   . Attends Archivist Meetings:   Marland Kitchen Marital Status:   Intimate Partner Violence:   . Fear of Current or Ex-Partner:   . Emotionally Abused:   Marland Kitchen Physically Abused:   . Sexually Abused:       PHYSICAL EXAM  Vitals:   05/12/20 1245  BP: 124/68  Pulse: 70  Weight: 276 lb 14.4 oz (125.6 kg)  Height: 6' (1.829 m)   Body mass index is 37.55 kg/m.  Generalized: Well developed, in no acute distress  Cardiology: normal rate and rhythm, no murmur noted Respiratory: clear to auscultation bilaterally  Neurological examination  Mentation: Alert oriented to time, place, history  taking. Follows all commands speech and language fluent Cranial nerve II-XII: Pupils were equal round reactive to light. Extraocular movements were full, visual field were full  Motor: The motor testing reveals 5 over 5 strength of all 4 extremities. Good symmetric motor tone is noted throughout.  Gait and station: Gait is normal.   DIAGNOSTIC DATA (LABS, IMAGING, TESTING) - I reviewed patient records, labs, notes, testing and imaging myself where available.  No flowsheet data found.   Lab Results  Component Value Date   WBC 4.0 02/06/2018   HGB 18.0 (H) 05/31/2018   HCT 53.0 (H) 05/31/2018   MCV 87.2 02/06/2018   PLT 297 02/06/2018      Component Value Date/Time   NA 137 05/31/2018 1433   K 5.0 05/31/2018 1433   CL 99 05/31/2018 1433   CO2 25 02/06/2018 1743   GLUCOSE 99 05/31/2018 1433   BUN 24 (H) 05/31/2018 1433   CREATININE 1.30 (H) 05/31/2018 1433   CREATININE 1.08 10/13/2014 1958   CALCIUM 9.1 02/06/2018 1743   PROT 7.4 10/13/2014 1958   ALBUMIN 4.6 10/13/2014 1958   AST 29 10/13/2014 1958   ALT 20 10/13/2014 1958   ALKPHOS 51 10/13/2014 1958   BILITOT 1.1 10/13/2014 1958   GFRNONAA >60 02/06/2018 1743   GFRNONAA >89 10/13/2014 1958   GFRAA >60 02/06/2018 1743   GFRAA >89 10/13/2014 1958   No results found for: CHOL, HDL, LDLCALC, LDLDIRECT, TRIG, CHOLHDL Lab Results  Component Value Date   HGBA1C 5.2 10/13/2014   No results found for: VITAMINB12 Lab Results  Component Value Date   TSH 5.291 (H) 10/13/2014       ASSESSMENT AND PLAN 34 y.o. year old male  has a past medical history of Asthma and Hypertension. here with     ICD-10-CM   1. OSA on CPAP  G47.33    Z99.89   2. Morning headache  R51.9     Jermar is doing well.  He continues to work on daily and 4-hour compliance.  He does note  significant improvement in sleep quality and daytime energy when he is using his CPAP appropriately.  Compliance report over the past 90 days shows acceptable  daily compliance but suboptimal 4-hour compliance.  He was encouraged to continue working towards daily compliance with 4 hours of usage each night.  Risk of untreated sleep apnea reviewed.  He was provided additional information in his AVS.  He was encouraged to continue healthy lifestyle habits.  He will follow-up with me in 3 months, sooner if needed.  He verbalizes understanding and agreement with this plan.   No orders of the defined types were placed in this encounter.    No orders of the defined types were placed in this encounter.   I spent 15 minutes with the patient. 50% of this time was spent counseling and educating patient on plan of care and medications.    Debbora Presto, FNP-C 05/12/2020, 1:00 PM [facility] [address], [place], [place] [address] [phone]  I reviewed the above note and documentation by the Nurse Practitioner and agree with the history, exam, assessment and plan as outlined above. I was available for consultation. Star Age, MD, PhD [facility])

## 2020-06-15 ENCOUNTER — Telehealth: Payer: 59 | Admitting: Family

## 2020-06-15 DIAGNOSIS — R300 Dysuria: Secondary | ICD-10-CM

## 2020-06-15 NOTE — Progress Notes (Signed)
Based on what you shared with me, I feel your condition warrants further evaluation and I recommend that you be seen for a face to face office visit.  Male bladder infections are not very common.  We worry about prostate or kidney conditions.  The standard of care is to examine the abdomen and kidneys, and to do a urine and blood test to make sure that something more serious is not going on.  We recommend that you see a provider today.  If your doctor's office is closed [facility] has the following Urgent Cares:    NOTE: If you entered your credit card information for this eVisit, you will not be charged. You may see a "hold" on your card for the $35 but that hold will drop off and you will not have a charge processed.   If you are having a true medical emergency please call 911.     For an urgent face to face visit, [facility] has four urgent care centers for your convenience:    NEW:  [facility] [phone] [address] , [place] [address] .  Monday - Friday 10 am - 6 pm    . [facility]    [phone]                  Get Driving Directions  [address] [place], [place] [address] . 10 am to 8 pm Monday-Friday . 12 pm to 8 pm Saturday-Sunday   . [facility]  [phone]                  Get Driving Directions  [address] [place], [place] [address] . 8 am to 8 pm Monday-Friday . 9 am to 6 pm Saturday . 11 am to 6 pm Sunday   . [facility] [place]  [phone]                  Get Driving Directions   [address] [place], [place] [address] . 8 am to 8 pm Monday-Friday . 8 am to 4 pm Saturday-Sunday    .  [facility]gent [facility] at [facility]                    Get Driving Directions  [phone]  [address] [place], [place] [address]  . Monday-Friday, 12 PM to 6 PM    Your e-visit  answers were reviewed by a board certified advanced clinical practitioner to complete your personal care plan.  Thank you for using e-Visits.  

## 2020-06-16 MED ORDER — SULFAMETHOXAZOLE-TRIMETHOPRIM 800-160 MG PO TABS: 1.0000 | ORAL_TABLET | Freq: Two times a day (BID) | ORAL | 0 refills | Status: DC

## 2020-06-16 NOTE — Addendum Note (Signed)
Addended by: Bennie Pierini on: 06/16/2020 08:38 AM   Modules accepted: Orders

## 2020-06-22 ENCOUNTER — Telehealth: Payer: Self-pay

## 2020-06-22 NOTE — Telephone Encounter (Signed)
LVM, For pt to call back Calling about 08/17/20 appt

## 2020-08-17 ENCOUNTER — Ambulatory Visit: Payer: Managed Care, Other (non HMO) | Admitting: Family Medicine

## 2020-08-17 ENCOUNTER — Encounter: Payer: Self-pay | Admitting: Family Medicine

## 2020-08-17 NOTE — Progress Notes (Deleted)
PATIENT: Rodney Nunez DOB: [birth year]  REASON FOR VISIT: follow up HISTORY FROM: patient  No chief complaint on file.    HISTORY OF PRESENT ILLNESS: Today 08/17/20 Rodney Nunez is a 34 y.o. male here today for follow up for OSA on CPAP.   Compliance report dated 07/18/2020 through 08/16/2020 reveals that he used CPAP 25 of the past 30 days for compliance of 83%.  He used CPAP greater than 4 hours 18 of the past 30 days for compliance of 60%.  Average usage on days used was 5 hours and 46 minutes.  Residual AHI was 2.0 on 6 to 12 cm of water.  EPR off.  There was no leak noted.  HISTORY: (copied from my note on 05/12/2020)  Rodney Nunez is a 34 y.o. male here today for follow up for OSA on CPAP. He was last seen in 01/2020. He has had difficulty with compliance due to working third shift. He feels that he is doing better. He does note improvement in sleep quality and feels more energized when he can use CPAP > 4 hours. He does mention going on vacation and forgetting his machine. He is feeling well today and without complaints.   Compliance report dated 04/12/2020 through 05/12/2019 where he used CPAP 20 in the past 30 days for compliance of 67%.  4-hour usage at 47% compliance.  Review of 90-day compliance dated 02/12/2020 through 05/11/2020 reveals 83% compliance with daily usage and 60% compliance with 4-hour usage.  Residual AHI is 1.1 on 6 to 12 cm of water and an EPR of 3.  There was no leak noted.  HISTORY: (copied from Dr Guadelupe Sabin note on 01/29/2020)  Rodney Nunez is a 34 year old right-handed gentleman with an underlying medical history of hypertension, asthma, recurrent headaches, reflux disease, and obesity, who  Presents for follow-up consultation of his obstructive sleep apnea and recurrent headaches after interim testing and starting AutoPap therapy. The patient is unaccompanied today. I first met him on 10/27/2019 at the request of his primary care physician,  at which time the patient reported snoring and somnolence, he is a third shift worker. He had a home sleep test on 11/17/2019 which indicated moderate obstructive sleep apnea by number of events with a total AHI of 27/h, O2 nadir was 90%. He was advised to start AutoPap therapy.  Today, 01/29/2020: I reviewed his AutoPap compliance data from2/15/2021 through 01/27/2020 which is a total of 30 days, during which time he used his machine 26 days with percent use days greater than 4 hours at 57%, indicating mildly suboptimal compliance, average usage of 5 hours and 7 minutes, residual AHI at goal at 1.1/h, average pressure of 10 cm, leak on the low side with a 95th percentile at 9 L/min on a pressure range of 6 to 12 cm. His best compliance was when he first got the machine, set up date was 12/24/2019 and his compliance for more than 4 hours was 67%, still slightly suboptimal. He reports feeling much improved, particularly with regards to his headaches. He continues to work third shift, typically from 8 PM to 6:30 AM on Sundays, Mondays, Thursdays, and Fridays. 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 He reports that he does not do this very frequently and is mindful that he should not drive when sleepy. He had some trouble with his initial mask which accounts for some of the lower use it in the beginning, he got a  different style of full facemask and likes it better. He is motivated to continue with treatment and increase his usage.  The patient's allergies, current medications, family history, past medical history, past social history, past surgical history and problem list were reviewed and updated as appropriate.  Previously:  10/27/2019: (He) reports snoring and excessive somnolence. I reviewed your office note from 10/13/2019. His Epworth sleepiness score is 11 out of 24, fatigue severity score is 48 out of 63. He does not wake up rested. He is a third shift worker for  years. 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 Bedtime is generally between 6 and 7 AM and he may sleep until 6 PM. He has fairly frequent urination after going to bed, has to get up 3 times typically and has woken up with a headache. He lives with his fiance and 31 year old son. He drinks caffeine in the form of energy drinks about 3-4 times out of the week when he is at work. He is a non-smoker and drinks alcohol about twice a week. For his headaches he was given a prescription for Imitrex as needed, which has helped. His mother has sleep apnea and uses a CPAP machine. He would consider using a CPAP machine if the need arises. 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 He has a TV in the bedroom and it is sometimes on when he goes to bed, they have no pets in the household.   REVIEW OF SYSTEMS: Out of a complete 14 system review of symptoms, the patient complains only of the following symptoms, and all other reviewed systems are negative.  ALLERGIES: Allergies  Allergen Reactions   Penicillins Nausea And Vomiting    Has patient had a PCN reaction causing immediate rash, facial/tongue/throat swelling, SOB or lightheadedness with hypotension:YES Has patient had a PCN reaction causing severe rash involving mucus membranes or skin necrosis: NO Has patient had a PCN reaction that required hospitalization NO Has patient had a PCN reaction occurring within the last 10 years: NO If all of the above answers are "NO", then may proceed with Cephalosporin use.    HOME MEDICATIONS: Outpatient Medications Prior to Visit  Medication Sig Dispense Refill   amLODipine (NORVASC) 5 MG tablet Take 5 mg by mouth daily.     sulfamethoxazole-trimethoprim (BACTRIM DS) 800-160 MG tablet Take 1 tablet by mouth 2 (two) times daily. 20 tablet 0   SUMAtriptan Succinate (IMITREX PO) Take by mouth.     No facility-administered medications prior to visit.    PAST  MEDICAL HISTORY: Past Medical History:  Diagnosis Date   Asthma    Hypertension     PAST SURGICAL HISTORY: Past Surgical History:  Procedure Laterality Date   IR ANGIOGRAM EXTREMITY RIGHT  02/07/2018   IR US GUIDE VASC ACCESS RIGHT  02/07/2018   MOUTH SURGERY      FAMILY HISTORY: Family History  Problem Relation Age of Onset   Heart attack Father    Heart disease Father    Hypertension Sister    Hypertension Mother    Diabetes Mother     SOCIAL HISTORY: Social History   Socioeconomic History   Marital status: Single    Spouse name: Not on file   Number of children: Not on file   Years of education: Not on file   Highest education level: Not on file  Occupational History   Not on file  Tobacco Use   Smoking status: Never  Smoker   Smokeless tobacco: Never Used  Substance and Sexual Activity   Alcohol use: Yes    Comment: occassional   Drug use: No   Sexual activity: Yes  Other Topics Concern   Not on file  Social History Narrative   Not on file   Social Determinants of Health   Financial Resource Strain:    Difficulty of Paying Living Expenses: Not on file  Food Insecurity:    Worried About [place] in the Last Year: Not on file   Ran Out of Food in the Last Year: Not on file  Transportation Needs:    Lack of Transportation (Medical): Not on file   Lack of Transportation (Non-Medical): Not on file  Physical Activity:    Days of Exercise per Week: Not on file   Minutes of Exercise per Session: Not on file  Stress:    Feeling of Stress : Not on file  Social Connections:    Frequency of Communication with Friends and Family: Not on file   Frequency of Social Gatherings with Friends and Family: Not on file   Attends Religious Services: Not on file   Active Member of Clubs or Organizations: Not on file   Attends Archivist Meetings: Not on file   Marital Status: Not on file  Intimate Partner  Violence:    Fear of Current or Ex-Partner: Not on file   Emotionally Abused: Not on file   Physically Abused: Not on file   Sexually Abused: Not on file      PHYSICAL EXAM  There were no vitals filed for this visit. There is no height or weight on file to calculate BMI.  Generalized: Well developed, in no acute distress  Cardiology: normal rate and rhythm, no murmur noted Respiratory: clear to auscultation bilaterally  Neurological examination  Mentation: Alert oriented to time, place, history taking. Follows all commands speech and language fluent Cranial nerve II-XII: Pupils were equal round reactive to light. Extraocular movements were full, visual field were full  Motor: The motor testing reveals 5 over 5 strength of all 4 extremities. Good symmetric motor tone is noted throughout.  Gait and station: Gait is normal.    DIAGNOSTIC DATA (LABS, IMAGING, TESTING) - I reviewed patient records, labs, notes, testing and imaging myself where available.  No flowsheet data found.   Lab Results  Component Value Date   WBC 4.0 02/06/2018   HGB 18.0 (H) 05/31/2018   HCT 53.0 (H) 05/31/2018   MCV 87.2 02/06/2018   PLT 297 02/06/2018      Component Value Date/Time   NA 137 05/31/2018 1433   K 5.0 05/31/2018 1433   CL 99 05/31/2018 1433   CO2 25 02/06/2018 1743   GLUCOSE 99 05/31/2018 1433   BUN 24 (H) 05/31/2018 1433   CREATININE 1.30 (H) 05/31/2018 1433   CREATININE 1.08 10/13/2014 1958   CALCIUM 9.1 02/06/2018 1743   PROT 7.4 10/13/2014 1958   ALBUMIN 4.6 10/13/2014 1958   AST 29 10/13/2014 1958   ALT 20 10/13/2014 1958   ALKPHOS 51 10/13/2014 1958   BILITOT 1.1 10/13/2014 1958   GFRNONAA >60 02/06/2018 1743   GFRNONAA >89 10/13/2014 1958   GFRAA >60 02/06/2018 1743   GFRAA >89 10/13/2014 1958   No results found for: CHOL, HDL, LDLCALC, LDLDIRECT, TRIG, CHOLHDL Lab Results  Component Value Date   HGBA1C 5.2 10/13/2014   No results found for: [id number] Lab  Results  Component Value Date  TSH 5.291 (H) 10/13/2014       ASSESSMENT AND PLAN 34 y.o. year old male  has a past medical history of Asthma and Hypertension. here with   No diagnosis found.   Mr. Lazenby continues to work on CPAP compliance.  Most recent report reveals daily compliance at 83% with 4-hour compliance at 60%.  We have reviewed risk of untreated sleep apnea and insurance requirements for compliance.  I have encouraged him to continue working towards a goal of nightly use with greater than 4 hours each night.  Healthy lifestyle habits encouraged.  I will follow-up with him in 6 months, sooner if needed.  He verbalizes understanding and agreement with this plan.  No orders of the defined types were placed in this encounter.    No orders of the defined types were placed in this encounter.     I spent 15 minutes with the patient. 50% of this time was spent counseling and educating patient on plan of care and medications.    Debbora Presto, FNP-C 08/17/2020, 7:37 AM [facility] [address], [place], [place] [address] [phone]

## 2020-08-24 ENCOUNTER — Ambulatory Visit: Payer: Managed Care, Other (non HMO) | Admitting: Family Medicine

## 2020-08-24 ENCOUNTER — Other Ambulatory Visit: Payer: Self-pay

## 2020-08-24 ENCOUNTER — Encounter: Payer: Self-pay | Admitting: Family Medicine

## 2020-08-24 VITALS — BP 129/89 | HR 64 | Ht 72.0 in | Wt 277.0 lb

## 2020-08-24 DIAGNOSIS — Z9989 Dependence on other enabling machines and devices: Secondary | ICD-10-CM | POA: Diagnosis not present

## 2020-08-24 DIAGNOSIS — G4733 Obstructive sleep apnea (adult) (pediatric): Secondary | ICD-10-CM | POA: Diagnosis not present

## 2020-08-24 NOTE — Patient Instructions (Signed)
Please continue using your CPAP regularly. While your insurance requires that you use CPAP at least 4 hours each night on 70% of the nights, I recommend, that you not skip any nights and use it throughout the night if you can. Getting used to CPAP and staying with the treatment long term does take time and patience and discipline. Untreated obstructive sleep apnea when it is moderate to severe can have an adverse impact on cardiovascular health and raise her risk for heart disease, arrhythmias, hypertension, congestive heart failure, stroke and diabetes. Untreated obstructive sleep apnea causes sleep disruption, nonrestorative sleep, and sleep deprivation. This can have an impact on your day to day functioning and cause daytime sleepiness and impairment of cognitive function, memory loss, mood disturbance, and problems focussing. Using CPAP regularly can improve these symptoms.   You are doing better! Keep up the good work and shoot for nightly use for at least 4 hours.   Follow up in 6 months   Sleep Apnea Sleep apnea affects breathing during sleep. It causes breathing to stop for a short time or to become shallow. It can also increase the risk of:  Heart attack.  Stroke.  Being very overweight (obese).  Diabetes.  Heart failure.  Irregular heartbeat. The goal of treatment is to help you breathe normally again. What are the causes? There are three kinds of sleep apnea:  Obstructive sleep apnea. This is caused by a blocked or collapsed airway.  Central sleep apnea. This happens when the brain does not send the right signals to the muscles that control breathing.  Mixed sleep apnea. This is a combination of obstructive and central sleep apnea. The most common cause of this condition is a collapsed or blocked airway. This can happen if:  Your throat muscles are too relaxed.  Your tongue and tonsils are too large.  You are overweight.  Your airway is too small. What increases  the risk?  Being overweight.  Smoking.  Having a small airway.  Being older.  Being male.  Drinking alcohol.  Taking medicines to calm yourself (sedatives or tranquilizers).  Having family members with the condition. What are the signs or symptoms?  Trouble staying asleep.  Being sleepy or tired during the day.  Getting angry a lot.  Loud snoring.  Headaches in the morning.  Not being able to focus your mind (concentrate).  Forgetting things.  Less interest in sex.  Mood swings.  Personality changes.  Feelings of sadness (depression).  Waking up a lot during the night to pee (urinate).  Dry mouth.  Sore throat. How is this diagnosed?  Your medical history.  A physical exam.  A test that is done when you are sleeping (sleep study). The test is most often done in a sleep lab but may also be done at home. How is this treated?   Sleeping on your side.  Using a medicine to get rid of mucus in your nose (decongestant).  Avoiding the use of alcohol, medicines to help you relax, or certain pain medicines (narcotics).  Losing weight, if needed.  Changing your diet.  Not smoking.  Using a machine to open your airway while you sleep, such as: ? An oral appliance. This is a mouthpiece that shifts your lower jaw forward. ? A CPAP device. This device blows air through a mask when you breathe out (exhale). ? An EPAP device. This has valves that you put in each nostril. ? A BPAP device. This device blows air through  a mask when you breathe in (inhale) and breathe out.  Having surgery if other treatments do not work. It is important to get treatment for sleep apnea. Without treatment, it can lead to:  High blood pressure.  Coronary artery disease.  In men, not being able to have an erection (impotence).  Reduced thinking ability. Follow these instructions at home: Lifestyle  Make changes that your doctor recommends.  Eat a healthy diet.  Lose  weight if needed.  Avoid alcohol, medicines to help you relax, and some pain medicines.  Do not use any products that contain nicotine or tobacco, such as cigarettes, e-cigarettes, and chewing tobacco. If you need help quitting, ask your doctor. General instructions  Take over-the-counter and prescription medicines only as told by your doctor.  If you were given a machine to use while you sleep, use it only as told by your doctor.  If you are having surgery, make sure to tell your doctor you have sleep apnea. You may need to bring your device with you.  Keep all follow-up visits as told by your doctor. This is important. Contact a doctor if:  The machine that you were given to use during sleep bothers you or does not seem to be working.  You do not get better.  You get worse. Get help right away if:  Your chest hurts.  You have trouble breathing in enough air.  You have an uncomfortable feeling in your back, arms, or stomach.  You have trouble talking.  One side of your body feels weak.  A part of your face is hanging down. These symptoms may be an emergency. Do not wait to see if the symptoms will go away. Get medical help right away. Call your local emergency services (911 in the [place] Do not drive yourself to the hospital. Summary  This condition affects breathing during sleep.  The most common cause is a collapsed or blocked airway.  The goal of treatment is to help you breathe normally while you sleep. This information is not intended to replace advice given to you by your health care provider. Make sure you discuss any questions you have with your health care provider. Document Revised: 08/16/2018 Document Reviewed: 06/25/2018 Elsevier Patient Education  2020 [employer].

## 2020-08-24 NOTE — Progress Notes (Addendum)
PATIENT: Rodney Nunez DOB: 1986/09/25  REASON FOR VISIT: follow up HISTORY FROM: patient  Chief Complaint  Patient presents with  . Follow-up  . Sleep Apnea     HISTORY OF PRESENT ILLNESS: Today 08/24/20 SHAHEIM Nunez is a 34 y.o. male here today for follow up for OSA on CPAP. He is doing fairly well with CPAP usage. He continues to have difficulty meeting 4-hour compliance. He is working third shift. He feels that his sleep schedules are off due to this. He does use CPAP every time he goes to sleep. He does note more energy throughout the day when using CPAP.  Compliance report dated 07/25/2020 through 08/23/2020 reveals that he used CPAP 24 of the past 30 days for compliance of 80%. He used CPAP greater than 4 hours 17 of the past 30 days for compliance of 57%. Average usage on days used was 5 hours and 54 minutes. Residual AHI was 2.1 on 6 to 12 cm of water. There was no leak noted.   HISTORY: (copied from my note on 05/12/2020)  Rodney Nunez is a 34 y.o. male here today for follow up for OSA on CPAP. He was last seen in 01/2020. He has had difficulty with compliance due to working third shift. He feels that he is doing better. He does note improvement in sleep quality and feels more energized when he can use CPAP > 4 hours. He does mention going on vacation and forgetting his machine. He is feeling well today and without complaints.   Compliance report dated 04/12/2020 through 05/12/2019 where he used CPAP 20 in the past 30 days for compliance of 67%.  4-hour usage at 47% compliance.  Review of 90-day compliance dated 02/12/2020 through 05/11/2020 reveals 83% compliance with daily usage and 60% compliance with 4-hour usage.  Residual AHI is 1.1 on 6 to 12 cm of water and an EPR of 3.  There was no leak noted.   HISTORY: (copied from Dr Guadelupe Sabin note on 01/29/2020)  Rodney Nunez is a 34 year old right-handed gentleman with an underlying medical history of  hypertension, asthma, recurrent headaches, reflux disease, and obesity, who  Presents for follow-up consultation of his obstructive sleep apnea and recurrent headaches after interim testing and starting AutoPap therapy. The patient is unaccompanied today. I first met him on 10/27/2019 at the request of his primary care physician, at which time the patient reported snoring and somnolence, he is a third shift worker. He had a home sleep test on 11/17/2019 which indicated moderate obstructive sleep apnea by number of events with a total AHI of 27/h, O2 nadir was 90%. He was advised to start AutoPap therapy.  Today, 01/29/2020: I reviewed his AutoPap compliance data from2/15/2021 through 01/27/2020 which is a total of 30 days, during which time he used his machine 26 days with percent use days greater than 4 hours at 57%, indicating mildly suboptimal compliance, average usage of 5 hours and 7 minutes, residual AHI at goal at 1.1/h, average pressure of 10 cm, leak on the low side with a 95th percentile at 9 L/min on a pressure range of 6 to 12 cm. His best compliance was when he first got the machine, set up date was 12/24/2019 and his compliance for more than 4 hours was 67%, still slightly suboptimal. He reports feeling much improved, particularly with regards to his headaches. He continues to work third shift, typically from 8 PM to 6:30 AM on Sundays, Mondays, Thursdays, and Fridays.  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 He reports that he does not do this very frequently and is mindful that he should not drive when sleepy. He had some trouble with his initial mask which accounts for some of the lower use it in the beginning, he got a different style of full facemask and likes it better. He is motivated to continue with treatment and increase his usage.  The patient's allergies, current medications, family history, past medical history, past social history, past surgical history  and problem list were reviewed and updated as appropriate.  Previously:  10/27/2019: (He) reports snoring and excessive somnolence. I reviewed your office note from 10/13/2019. His Epworth sleepiness score is 11 out of 24, fatigue severity score is 48 out of 63. He does not wake up rested. He is a third shift worker for years. 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 Bedtime is generally between 6 and 7 AM and he may sleep until 6 PM. He has fairly frequent urination after going to bed, has to get up 3 times typically and has woken up with a headache. He lives with his fiance and 33 year old son. He drinks caffeine in the form of energy drinks about 3-4 times out of the week when he is at work. He is a non-smoker and drinks alcohol about twice a week. For his headaches he was given a prescription for Imitrex as needed, which has helped. His mother has sleep apnea and uses a CPAP machine. He would consider using a CPAP machine if the need arises. 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 He has a TV in the bedroom and it is sometimes on when he goes to bed, they have no pets in the household.   REVIEW OF SYSTEMS: Out of a complete 14 system review of symptoms, the patient complains only of the following symptoms, fatigue and all other reviewed systems are negative.  ESS: 10 FSS: 44  ALLERGIES: Allergies  Allergen Reactions  . Penicillins Nausea And Vomiting    Has patient had a PCN reaction causing immediate rash, facial/tongue/throat swelling, SOB or lightheadedness with hypotension:YES Has patient had a PCN reaction causing severe rash involving mucus membranes or skin necrosis: NO Has patient had a PCN reaction that required hospitalization NO Has patient had a PCN reaction occurring within the last 10 years: NO If all of the above answers are "NO", then may proceed with Cephalosporin use.    HOME MEDICATIONS: Outpatient Medications  Prior to Visit  Medication Sig Dispense Refill  . amLODipine (NORVASC) 5 MG tablet Take 5 mg by mouth daily.    . SUMAtriptan Succinate (IMITREX PO) Take by mouth.    . sulfamethoxazole-trimethoprim (BACTRIM DS) 800-160 MG tablet Take 1 tablet by mouth 2 (two) times daily. 20 tablet 0   No facility-administered medications prior to visit.    PAST MEDICAL HISTORY: Past Medical History:  Diagnosis Date  . Asthma   . Hypertension     PAST SURGICAL HISTORY: Past Surgical History:  Procedure Laterality Date  . IR ANGIOGRAM EXTREMITY RIGHT  02/07/2018  . IR US GUIDE VASC ACCESS RIGHT  02/07/2018  . MOUTH SURGERY      FAMILY HISTORY: Family History  Problem Relation Age of Onset  . Heart attack Father   . Heart disease Father   . Hypertension Sister   . Hypertension Mother   . Diabetes Mother  SOCIAL HISTORY: Social History   Socioeconomic History  . Marital status: Single    Spouse name: Not on file  . Number of children: Not on file  . Years of education: Not on file  . Highest education level: Not on file  Occupational History  . Not on file  Tobacco Use  . Smoking status: Never Smoker  . Smokeless tobacco: Never Used  Substance and Sexual Activity  . Alcohol use: Yes    Comment: occassional  . Drug use: No  . Sexual activity: Yes  Other Topics Concern  . Not on file  Social History Narrative  . Not on file   Social Determinants of Health   Financial Resource Strain:   . Difficulty of Paying Living Expenses: Not on file  Food Insecurity:   . Worried About Charity fundraiser in the Last Year: Not on file  . Ran Out of Food in the Last Year: Not on file  Transportation Needs:   . Lack of Transportation (Medical): Not on file  . Lack of Transportation (Non-Medical): Not on file  Physical Activity:   . Days of Exercise per Week: Not on file  . Minutes of Exercise per Session: Not on file  Stress:   . Feeling of Stress : Not on file  Social  Connections:   . Frequency of Communication with Friends and Family: Not on file  . Frequency of Social Gatherings with Friends and Family: Not on file  . Attends Religious Services: Not on file  . Active Member of Clubs or Organizations: Not on file  . Attends Archivist Meetings: Not on file  . Marital Status: Not on file  Intimate Partner Violence:   . Fear of Current or Ex-Partner: Not on file  . Emotionally Abused: Not on file  . Physically Abused: Not on file  . Sexually Abused: Not on file      PHYSICAL EXAM  Vitals:   08/24/20 1307  BP: 129/89  Pulse: 64  Weight: 277 lb (125.6 kg)  Height: 6' (1.829 m)   Body mass index is 37.57 kg/m.  Generalized: Well developed, in no acute distress  Cardiology: normal rate and rhythm, no murmur noted Respiratory: clear to auscultation bilaterally  Neurological examination  Mentation: Alert oriented to time, place, history taking. Follows all commands speech and language fluent Cranial nerve II-XII: Pupils were equal round reactive to light. Extraocular movements were full, visual field were full  Motor: The motor testing reveals 5 over 5 strength of all 4 extremities. Good symmetric motor tone is noted throughout.   Gait and station: Gait is normal.    DIAGNOSTIC DATA (LABS, IMAGING, TESTING) - I reviewed patient records, labs, notes, testing and imaging myself where available.  No flowsheet data found.   Lab Results  Component Value Date   WBC 4.0 02/06/2018   HGB 18.0 (H) 05/31/2018   HCT 53.0 (H) 05/31/2018   MCV 87.2 02/06/2018   PLT 297 02/06/2018      Component Value Date/Time   NA 137 05/31/2018 1433   K 5.0 05/31/2018 1433   CL 99 05/31/2018 1433   CO2 25 02/06/2018 1743   GLUCOSE 99 05/31/2018 1433   BUN 24 (H) 05/31/2018 1433   CREATININE 1.30 (H) 05/31/2018 1433   CREATININE 1.08 10/13/2014 1958   CALCIUM 9.1 02/06/2018 1743   PROT 7.4 10/13/2014 1958   ALBUMIN 4.6 10/13/2014 1958   AST  29 10/13/2014 1958   ALT 20 10/13/2014  1958   ALKPHOS 51 10/13/2014 1958   BILITOT 1.1 10/13/2014 1958   GFRNONAA >60 02/06/2018 1743   GFRNONAA >89 10/13/2014 1958   GFRAA >60 02/06/2018 1743   GFRAA >89 10/13/2014 1958   No results found for: CHOL, HDL, LDLCALC, LDLDIRECT, TRIG, CHOLHDL Lab Results  Component Value Date   HGBA1C 5.2 10/13/2014   No results found for: VITAMINB12 Lab Results  Component Value Date   TSH 5.291 (H) 10/13/2014       ASSESSMENT AND PLAN 34 y.o. year old male  has a past medical history of Asthma and Hypertension. here with     ICD-10-CM   1. OSA on CPAP  G47.33 For home use only DME continuous positive airway pressure (CPAP)   Z99.89     Chandlar continues to work towards daily and 4-hour compliance. Daily compliance is at 80% and 4-hour compliance suboptimal at 57%. This is most likely due to irregular sleep schedules as he is working third shift. He was encouraged to continue using CPAP every night and shoot for a minimum of 4 hours usage every day. Healthy lifestyle habits encouraged. He will follow-up with me in 6 months, sooner if needed. He verbalizes understanding and agreement with this plan.   Orders Placed This Encounter  Procedures  . For home use only DME continuous positive airway pressure (CPAP)    Supplies    Order Specific Question:   Length of Need    Answer:   Lifetime    Order Specific Question:   Patient has OSA or probable OSA    Answer:   Yes    Order Specific Question:   Is the patient currently using CPAP in the home    Answer:   Yes    Order Specific Question:   Settings    Answer:   Other see comments    Order Specific Question:   CPAP supplies needed    Answer:   Mask, headgear, cushions, filters, heated tubing and water chamber     No orders of the defined types were placed in this encounter.     I spent 15 minutes with the patient. 50% of this time was spent counseling and educating patient on plan of care  and medications.    Debbora Presto, FNP-C 08/24/2020, 1:42 PM [facility] [address], [place], [place] [address] [phone]  I reviewed the above note and documentation by the Nurse Practitioner and agree with the history, exam, assessment and plan as outlined above. I was available for consultation. Star Age, MD, PhD [facility])

## 2020-12-21 ENCOUNTER — Encounter: Payer: Self-pay | Admitting: Family Medicine

## 2020-12-21 ENCOUNTER — Other Ambulatory Visit: Payer: Self-pay | Admitting: Internal Medicine

## 2020-12-21 DIAGNOSIS — E785 Hyperlipidemia, unspecified: Secondary | ICD-10-CM

## 2020-12-21 DIAGNOSIS — Z8249 Family history of ischemic heart disease and other diseases of the circulatory system: Secondary | ICD-10-CM

## 2020-12-22 ENCOUNTER — Telehealth: Payer: 59 | Admitting: Nurse Practitioner

## 2020-12-22 DIAGNOSIS — R519 Headache, unspecified: Secondary | ICD-10-CM

## 2020-12-22 NOTE — Progress Notes (Signed)
Based on what you shared with me it looks like you have headache with blurred vision,that should be evaluated in a face to face office visit. We do not treat headaches with blurred vision in an evisit.    NOTE: If you entered your credit card information for this eVisit, you will not be charged. You may see a "hold" on your card for the $35 but that hold will drop off and you will not have a charge processed.  If you are having a true medical emergency please call 911.     For an urgent face to face visit, [facility] has four urgent care centers for your convenience:   . [facility]    [phone]                  Get Driving Directions  [address] [place], [place] [address] . 10 am to 8 pm Monday-Friday . 12 pm to 8 pm Saturday-Sunday   . [facility]  [phone]                  Get Driving Directions  [address] [place], [place] [address] . 8 am to 8 pm Monday-Friday . 9 am to 6 pm Saturday . 11 am to 6 pm Sunday   . [facility]  [phone]                  Get Driving Directions   [address] [place], [place] [address] . 8 am to 8 pm Monday-Friday . 8 am to 4 pm Saturday-Sunday    . [facility] at [facility] Directions  [phone]  [address] [place], [place] [address]  . Monday-Friday, 12 PM to 6 PM    Your e-visit answers were reviewed by a board certified advanced clinical practitioner to complete your personal care plan.  Thank you for using e-Visits.

## 2021-02-14 ENCOUNTER — Telehealth: Payer: Self-pay | Admitting: Family Medicine

## 2021-02-14 NOTE — Telephone Encounter (Signed)
4/7 appointment cancelled due to NP being out of office. Called pt as well, unable to LVM.

## 2021-02-17 ENCOUNTER — Ambulatory Visit: Payer: Managed Care, Other (non HMO) | Admitting: Family Medicine

## 2021-02-24 ENCOUNTER — Ambulatory Visit: Payer: Managed Care, Other (non HMO) | Admitting: Family Medicine

## 2021-03-24 ENCOUNTER — Other Ambulatory Visit: Payer: 59

## 2021-04-14 ENCOUNTER — Other Ambulatory Visit: Payer: 59

## 2021-05-09 ENCOUNTER — Ambulatory Visit
Admission: RE | Admit: 2021-05-09 | Discharge: 2021-05-09 | Disposition: A | Discharge status: Home / Self Care | Payer: Self-pay | Source: Ambulatory Visit | Attending: Internal Medicine | Admitting: Internal Medicine

## 2021-05-09 ENCOUNTER — Other Ambulatory Visit: Payer: Self-pay

## 2021-05-09 DIAGNOSIS — E785 Hyperlipidemia, unspecified: Secondary | ICD-10-CM

## 2021-05-09 DIAGNOSIS — Z8249 Family history of ischemic heart disease and other diseases of the circulatory system: Secondary | ICD-10-CM

## 2021-05-11 ENCOUNTER — Ambulatory Visit: Payer: Managed Care, Other (non HMO) | Admitting: Family Medicine

## 2021-05-11 ENCOUNTER — Encounter: Payer: Self-pay | Admitting: Family Medicine

## 2021-05-11 VITALS — BP 118/92 | HR 72 | Ht 72.0 in | Wt 278.4 lb

## 2021-05-11 DIAGNOSIS — G4733 Obstructive sleep apnea (adult) (pediatric): Secondary | ICD-10-CM

## 2021-05-11 DIAGNOSIS — Z9989 Dependence on other enabling machines and devices: Secondary | ICD-10-CM | POA: Diagnosis not present

## 2021-05-11 NOTE — Progress Notes (Addendum)
PATIENT: Rodney Nunez DOB: Mar 17, 1986  REASON FOR VISIT: follow up HISTORY FROM: patient  Chief Complaint  Patient presents with   Obstructive Sleep Apnea    Rm 2, alone. Here for cpap f/u, reports doing well with cpap therapy. Has no issues or concerns at this time.       HISTORY OF PRESENT ILLNESS: 05/11/2021 ALL:  Rodney Nunez returns for follow up for OSA on CPAP. He continues to work on improving compliance. He continues to have difficult remembering to put his mask on before falling asleep but is trying to use reminders. He does feel some benefit with using CPAP.     08/24/2020 ALL:  Rodney Nunez is a 35 y.o. male here today for follow up for OSA on CPAP. He is doing fairly well with CPAP usage. He continues to have difficulty meeting 4-hour compliance. He is working third shift. He feels that his sleep schedules are off due to this. He does use CPAP every time he goes to sleep. He does note more energy throughout the day when using CPAP.  Compliance report dated 07/25/2020 through 08/23/2020 reveals that he used CPAP 24 of the past 30 days for compliance of 80%. He used CPAP greater than 4 hours 17 of the past 30 days for compliance of 57%. Average usage on days used was 5 hours and 54 minutes. Residual AHI was 2.1 on 6 to 12 cm of water. There was no leak noted.   HISTORY: (copied from my note on 05/12/2020)  Rodney Nunez is a 35 y.o. male here today for follow up for OSA on CPAP. He was last seen in 01/2020. He has had difficulty with compliance due to working third shift. He feels that he is doing better. He does note improvement in sleep quality and feels more energized when he can use CPAP > 4 hours. He does mention going on vacation and forgetting his machine. He is feeling well today and without complaints.    Compliance report dated 04/12/2020 through 05/12/2019 where he used CPAP 20 in the past 30 days for compliance of 67%.  4-hour usage at 47% compliance.   Review of 90-day compliance dated 02/12/2020 through 05/11/2020 reveals 83% compliance with daily usage and 60% compliance with 4-hour usage.  Residual AHI is 1.1 on 6 to 12 cm of water and an EPR of 3.  There was no leak noted.     HISTORY: (copied from Dr Guadelupe Sabin note on 01/29/2020)   Rodney Nunez is a 35 year old right-handed gentleman with an underlying medical history of hypertension, asthma, recurrent headaches, reflux disease, and obesity, who  Presents for follow-up consultation of his obstructive sleep apnea and recurrent headaches after interim testing and starting AutoPap therapy.  The patient is unaccompanied today.  I first met him on 10/27/2019 at the request of his primary care physician, at which time the patient reported snoring and somnolence, he is a third shift worker.  He had a home sleep test on 11/17/2019 which indicated moderate obstructive sleep apnea by number of events with a total AHI of 27/h, O2 nadir was 90%.  He was advised to start AutoPap therapy.   Today, 01/29/2020: I reviewed his AutoPap compliance data from 12/29/2019 through 01/27/2020 which is a total of 30 days, during which time he used his machine 26 days with percent use days greater than 4 hours at 57%, indicating mildly suboptimal compliance, average usage of 5 hours and 7 minutes, residual AHI at goal  at 1.1/h, average pressure of 10 cm, leak on the low side with a 95th percentile at 9 L/min on a pressure range of 6 to 12 cm.  His best compliance was when he first got the machine, set up date was 12/24/2019 and his compliance for more than 4 hours was 67%, still slightly suboptimal.  He reports feeling much improved, particularly with regards to his headaches.  He continues to work third shift, typically from 8 PM to 6:30 AM on Sundays, Mondays, Thursdays, and Fridays.  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  He reports that he does not do this very frequently and is mindful that he should not drive  when sleepy.  He had some trouble with his initial mask which accounts for some of the lower use it in the beginning, he got a different style of full facemask and likes it better.  He is motivated to continue with treatment and increase his usage.   The patient's allergies, current medications, family history, past medical history, past social history, past surgical history and problem list were reviewed and updated as appropriate.    Previously:   10/27/2019: (He) reports snoring and excessive somnolence.  I reviewed your office note from 10/13/2019.  His Epworth sleepiness score is 11 out of 24, fatigue severity score is 48 out of 63.  He does not wake up rested.  He is a third shift worker for years.  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  Bedtime is generally between 6 and 7 AM and he may sleep until 6 PM.  He has fairly frequent urination after going to bed, has to get up 3 times typically and has woken up with a headache.  He lives with his fiance and 61 year old son.  He drinks caffeine in the form of energy drinks about 3-4 times out of the week when he is at work.  He is a non-smoker and drinks alcohol about twice a week.  For his headaches he was given a prescription for Imitrex as needed, which has helped.  His mother has sleep apnea and uses a CPAP machine.  He would consider using a CPAP machine if the need arises.  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  He has a TV in the bedroom and it is sometimes on when he goes to bed, they have no pets in the household.   REVIEW OF SYSTEMS: Out of a complete 14 system review of symptoms, the patient complains only of the following symptoms, fatigue and all other reviewed systems are negative.  ESS: 10 FSS: 44  ALLERGIES: Allergies  Allergen Reactions   Penicillins Nausea And Vomiting    Has patient had a PCN reaction causing immediate rash, facial/tongue/throat swelling, SOB or lightheadedness with  hypotension:YES Has patient had a PCN reaction causing severe rash involving mucus membranes or skin necrosis: NO Has patient had a PCN reaction that required hospitalization NO Has patient had a PCN reaction occurring within the last 10 years: NO If all of the above answers are "NO", then may proceed with Cephalosporin use.    HOME MEDICATIONS: Outpatient Medications Prior to Visit  Medication Sig Dispense Refill   amLODipine (NORVASC) 5 MG tablet Take 5 mg by mouth daily.     SUMAtriptan Succinate (IMITREX PO) Take by mouth.     No facility-administered medications prior to visit.    PAST MEDICAL HISTORY: Past  Medical History:  Diagnosis Date   Asthma    Hypertension     PAST SURGICAL HISTORY: Past Surgical History:  Procedure Laterality Date   IR ANGIOGRAM EXTREMITY RIGHT  02/07/2018   IR US GUIDE VASC ACCESS RIGHT  02/07/2018   MOUTH SURGERY      FAMILY HISTORY: Family History  Problem Relation Age of Onset   Heart attack Father    Heart disease Father    Hypertension Sister    Hypertension Mother    Diabetes Mother     SOCIAL HISTORY: Social History   Socioeconomic History   Marital status: Married    Spouse name: Not on file   Number of children: Not on file   Years of education: Not on file   Highest education level: Not on file  Occupational History   Not on file  Tobacco Use   Smoking status: Never   Smokeless tobacco: Never  Substance and Sexual Activity   Alcohol use: Yes    Comment: occassional   Drug use: No   Sexual activity: Yes  Other Topics Concern   Not on file  Social History Narrative   Not on file   Social Determinants of Health   Financial Resource Strain: Not on file  Food Insecurity: Not on file  Transportation Needs: Not on file  Physical Activity: Not on file  Stress: Not on file  Social Connections: Not on file  Intimate Partner Violence: Not on file      PHYSICAL EXAM  Vitals:   05/11/21 0807  BP: (!) 118/92   Pulse: 72  Weight: 278 lb 6.4 oz (126.3 kg)  Height: 6' (1.829 m)    Body mass index is 37.76 kg/m.  Generalized: Well developed, in no acute distress  Cardiology: normal rate and rhythm, no murmur noted Respiratory: clear to auscultation bilaterally  Neurological examination  Mentation: Alert oriented to time, place, history taking. Follows all commands speech and language fluent Cranial nerve II-XII: Pupils were equal round reactive to light. Extraocular movements were full, visual field were full  Motor: The motor testing reveals 5 over 5 strength of all 4 extremities. Good symmetric motor tone is noted throughout.   Gait and station: Gait is normal.    DIAGNOSTIC DATA (LABS, IMAGING, TESTING) - I reviewed patient records, labs, notes, testing and imaging myself where available.  No flowsheet data found.   Lab Results  Component Value Date   WBC 4.0 02/06/2018   HGB 18.0 (H) 05/31/2018   HCT 53.0 (H) 05/31/2018   MCV 87.2 02/06/2018   PLT 297 02/06/2018      Component Value Date/Time   NA 137 05/31/2018 1433   K 5.0 05/31/2018 1433   CL 99 05/31/2018 1433   CO2 25 02/06/2018 1743   GLUCOSE 99 05/31/2018 1433   BUN 24 (H) 05/31/2018 1433   CREATININE 1.30 (H) 05/31/2018 1433   CREATININE 1.08 10/13/2014 1958   CALCIUM 9.1 02/06/2018 1743   PROT 7.4 10/13/2014 1958   ALBUMIN 4.6 10/13/2014 1958   AST 29 10/13/2014 1958   ALT 20 10/13/2014 1958   ALKPHOS 51 10/13/2014 1958   BILITOT 1.1 10/13/2014 1958   GFRNONAA >60 02/06/2018 1743   GFRNONAA >89 10/13/2014 1958   GFRAA >60 02/06/2018 1743   GFRAA >89 10/13/2014 1958   No results found for: CHOL, HDL, LDLCALC, LDLDIRECT, TRIG, CHOLHDL Lab Results  Component Value Date   HGBA1C 5.2 10/13/2014   No results found for: DXAJOINO67 Lab Results  Component Value Date   TSH 5.291 (H) 10/13/2014     ASSESSMENT AND PLAN 35 y.o. year old male  has a past medical history of Asthma and Hypertension. here with      ICD-10-CM   1. OSA on CPAP  G47.33 For home use only DME continuous positive airway pressure (CPAP)   Z99.89        Kmari continues to work towards daily and 4-hour compliance. Daily compliance is at 80% and 4-hour compliance suboptimal at 67%. This is most likely due to irregular sleep schedules as he is working third shift. He was encouraged to continue using CPAP every night and shoot for a minimum of 4 hours usage every day. Healthy lifestyle habits encouraged. He will follow-up with me in 1 year, sooner if needed. He verbalizes understanding and agreement with this plan.   Orders Placed This Encounter  Procedures   For home use only DME continuous positive airway pressure (CPAP)    Supplies    Order Specific Question:   Length of Need    Answer:   Lifetime    Order Specific Question:   Patient has OSA or probable OSA    Answer:   Yes    Order Specific Question:   Is the patient currently using CPAP in the home    Answer:   Yes    Order Specific Question:   Settings    Answer:   Other see comments    Order Specific Question:   CPAP supplies needed    Answer:   Mask, headgear, cushions, filters, heated tubing and water chamber      No orders of the defined types were placed in this encounter.     Debbora Presto, FNP-C 05/11/2021, 8:38 AM [facility] [address] [place], [place] [address] [phone]  I reviewed the above note and documentation by the Nurse Practitioner and agree with the history, exam, assessment and plan as outlined above. I was available for consultation. Star Age, MD, PhD [facility])

## 2021-05-11 NOTE — Progress Notes (Signed)
CM sent to [facility] 

## 2021-05-11 NOTE — Patient Instructions (Signed)

## 2021-09-26 ENCOUNTER — Encounter (HOSPITAL_COMMUNITY): Payer: Self-pay

## 2021-09-26 ENCOUNTER — Other Ambulatory Visit: Payer: Self-pay

## 2021-09-26 ENCOUNTER — Ambulatory Visit (HOSPITAL_COMMUNITY)
Admission: EM | Admit: 2021-09-26 | Discharge: 2021-09-26 | Disposition: A | Discharge status: Home / Self Care | Payer: 59 | Attending: Sports Medicine | Admitting: Sports Medicine

## 2021-09-26 DIAGNOSIS — S39012A Strain of muscle, fascia and tendon of lower back, initial encounter: Secondary | ICD-10-CM | POA: Diagnosis not present

## 2021-09-26 DIAGNOSIS — M545 Low back pain, unspecified: Secondary | ICD-10-CM | POA: Diagnosis not present

## 2021-09-26 DIAGNOSIS — I10 Essential (primary) hypertension: Secondary | ICD-10-CM

## 2021-09-26 MED ORDER — CYCLOBENZAPRINE HCL 10 MG PO TABS: 10.0000 mg | ORAL_TABLET | Freq: Every day | ORAL | 0 refills | Status: DC

## 2021-09-26 MED ORDER — KETOROLAC TROMETHAMINE 30 MG/ML IJ SOLN
INTRAMUSCULAR | Status: AC
  Filled 2021-09-26: qty 1

## 2021-09-26 MED ORDER — KETOROLAC TROMETHAMINE 30 MG/ML IJ SOLN
30.0000 mg | Freq: Once | INTRAMUSCULAR | Status: AC
  Administered 2021-09-26: 30 mg via INTRAMUSCULAR

## 2021-09-26 NOTE — ED Triage Notes (Signed)
Pt report shooting pain in the lower back x 1 week. State [place] started "out of the blue". Denies pain in legs, buttocks. Ibuprofen 800 mg gives some relief.

## 2021-09-26 NOTE — Discharge Instructions (Addendum)
Once acute pain subsides, recommended heat over the back.  You may begin gentle range of motion and stretching exercises the low back and hamstring muscles.  Trial of Flexeril to be taken at bedtime only, this can cause some grogginess or dizziness.  So be sure do not take this around anytime you are operating machinery or driving.  Recommend taking this at home first monitor for side effects before continuing use.  If your pain continues, would recommend following up with the sports medicine center.

## 2021-09-26 NOTE — ED Provider Notes (Addendum)
[facility]    CSN: [id number] Arrival date & time: 09/26/21  1724      History   Chief Complaint Chief Complaint  Patient presents with   Back Pain    HPI Rodney Nunez is a 35 y.o. male here for left-sided low back pain x 1 week.  Patient presents for acute left-sided low back pain x1 week.  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  He denies any specific injury or sensation of feeling a pop in the back.  But states over the last week his low back has increased in pain.  It feels like a chronic achy pain, that will be sharp with certain activities.  The pain does go up the back into the mid thoracic region, however denies any radiation of pain into the lower extremities.  No numbness/tingling or radicular symptoms into the buttock or posterior leg.  He denies any lower extremity weakness.  Denies any bowel or bladder incontinence.  Denies any fever chills or signs of systemic illness.  He has had intermittent back issues in the past, as he does note that his job is physically demanding but denies any known bony pathology.  He has been taking acetaminophen for the pain, which does help with slightly although his pain still persist.  Past Medical History:  Diagnosis Date   Asthma    Hypertension     Patient Active Problem List   Diagnosis Date Noted   Hand pain 02/07/2018   Childhood asthma 02/24/2014   RHINITIS, ALLERGIC 01/10/2007   ACNE 01/10/2007   BICIPITAL TENDONITIS 01/10/2007    Past Surgical History:  Procedure Laterality Date   IR ANGIOGRAM EXTREMITY RIGHT  02/07/2018   IR US GUIDE VASC ACCESS RIGHT  02/07/2018   MOUTH SURGERY         Home Medications    Prior to Admission medications   Medication Sig Start Date End Date Taking? Authorizing Provider  cyclobenzaprine (FLEXERIL) 10 MG tablet Take 1 tablet (10 mg total) by mouth at bedtime. Do NOT take within 8 hours of driving or operating heavy  machinery. Recommend trialing this first at home to see if has SE's. 09/26/21  Yes Madelyn Brunner, DO  amLODipine (NORVASC) 5 MG tablet Take 5 mg by mouth daily.    [provider]  SUMAtriptan Succinate (IMITREX PO) Take by mouth.    [provider]    Family History Family History  Problem Relation Age of Onset   Heart attack Father    Heart disease Father    Hypertension Sister    Hypertension Mother    Diabetes Mother     Social History Social History   Tobacco Use   Smoking status: Never   Smokeless tobacco: Never  Substance Use Topics   Alcohol use: Yes    Comment: occassional   Drug use: Never     Allergies   Penicillins   Review of Systems Review of Systems  Constitutional:  Negative for chills and fever.  Musculoskeletal:  Positive for back pain. Negative for joint swelling.  Neurological:  Negative for weakness.    Physical Exam Triage Vital Signs ED Triage Vitals  Enc Vitals Group     BP 09/26/21 1825 (!) 157/97     Pulse Rate 09/26/21 1825 (!) 58     Resp 09/26/21 1825 18     Temp 09/26/21 1825 97.9 F (36.6 C)     Temp Source  09/26/21 1825 Oral     SpO2 09/26/21 1825 96 %     Weight --      Height --      Head Circumference --      Peak Flow --      Pain Score 09/26/21 1823 8     Pain Loc --      Pain Edu? --      Excl. in GC? --    No data found.  Updated Vital Signs BP (!) 157/97 (BP Location: Right Wrist)   Pulse (!) 58   Temp 97.9 F (36.6 C) (Oral)   Resp 18   SpO2 96%   Physical Exam Gen: Well-appearing, in no acute distress; non-toxic CV: Regular Rate. Well-perfused. Warm.  Resp: Breathing unlabored on room air; no wheezing. Psych: Fluid speech in conversation; appropriate affect; normal thought process Neuro: Sensation intact throughout. No gross coordination deficits.  MSK:   Low back: There is no gross deformity or scoliosis; inspection yields no erythema, ecchymosis or overlying skin changes.  There  is no TTP over the spinous processes down the thoracic or lumbar spine.  There is paraspinal hypertonicity of the left sided lumbar paraspinal muscles.  There is pain with both flexion and extension at endrange, although full range of motion.  Strength is 5/5 of the lower extremity in the L4-S1 dermatome.  Bilateral Achilles and patellar tendon reflexes intact.  Patient is neurovascular intact distally.  Straight leg raise, negative modified slump's test.   UC Treatments / Results  Labs (all labs ordered are listed, but only abnormal results are displayed) Labs Reviewed - No data to display  EKG   Radiology No results found.  Procedures Procedures (including critical care time)  Medications Ordered in UC Medications  ketorolac (TORADOL) 30 MG/ML injection 30 mg (30 mg Intramuscular Given 09/26/21 1856)    Initial Impression / Assessment and Plan / UC Course  I have reviewed the triage vital signs and the nursing notes.  Pertinent labs & imaging results that were available during my care of the patient were reviewed by me and considered in my medical decision making (see chart for details).     Left-sided low back pain, acute Lumbar parspinal hypertonicity and muscle spasm HTN  Insidious onset of left-sided low back pain.  Exam and history most consistent with lumbar strain with occasional muscle spasm.  For his acute pain, did provide patient with IM dose of Toradol today.  Recommend that he avoid taking prolonged course of NSAIDs given his hypertension.  Recommend that he follow-up with his PCP for his blood pressure.  He may take Tylenol as needed.  We did give a muscle relaxer to be taken at bedtime only.  Counseled the patient extensively on avoiding driving or operating any machinery within at least 8 hours of taking this.  Recommended home stretching program.  If his pain continues, recommended following up with sports medicine center or his PCP.  Final Clinical  Impressions(s) / UC Diagnoses   Final diagnoses:  Acute left-sided low back pain without sciatica  Strain of lumbar region, initial encounter     Discharge Instructions      Once acute pain subsides, recommended heat over the back.  You may begin gentle range of motion and stretching exercises the low back and hamstring muscles.  Trial of Flexeril to be taken at bedtime only, this can cause some grogginess or dizziness.  So be sure do not take this around anytime you are  operating machinery or driving.  Recommend taking this at home first monitor for side effects before continuing use.  If your pain continues, would recommend following up with the sports medicine center.     ED Prescriptions     Medication Sig Dispense Auth. Provider   cyclobenzaprine (FLEXERIL) 10 MG tablet Take 1 tablet (10 mg total) by mouth at bedtime. Do NOT take within 8 hours of driving or operating heavy machinery. Recommend trialing this first at home to see if has SE's. 15 tablet Madelyn Brunner, DO      PDMP not reviewed this encounter.   Madelyn Brunner, DO 09/26/21 1903    Madelyn Brunner, DO 09/26/21 1904

## 2022-05-11 ENCOUNTER — Ambulatory Visit: Payer: Self-pay | Admitting: Family Medicine

## 2022-06-21 ENCOUNTER — Telehealth: Payer: Self-pay | Admitting: Family Medicine

## 2022-06-21 NOTE — Telephone Encounter (Signed)
Called pt to r/s 8/17 appt- VM box full. Sent mychart msg informing him of r/s needed, Amy out.

## 2022-06-29 ENCOUNTER — Ambulatory Visit: Payer: 59 | Admitting: Family Medicine

## 2022-07-04 ENCOUNTER — Emergency Department (HOSPITAL_BASED_OUTPATIENT_CLINIC_OR_DEPARTMENT_OTHER)
Admission: EM | Admit: 2022-07-04 | Discharge: 2022-07-04 | Disposition: A | Discharge status: Home / Self Care | Payer: Managed Care, Other (non HMO) | Attending: Emergency Medicine | Admitting: Emergency Medicine

## 2022-07-04 ENCOUNTER — Encounter (HOSPITAL_BASED_OUTPATIENT_CLINIC_OR_DEPARTMENT_OTHER): Payer: Self-pay

## 2022-07-04 ENCOUNTER — Emergency Department (HOSPITAL_BASED_OUTPATIENT_CLINIC_OR_DEPARTMENT_OTHER): Payer: Managed Care, Other (non HMO)

## 2022-07-04 DIAGNOSIS — R350 Frequency of micturition: Secondary | ICD-10-CM | POA: Insufficient documentation

## 2022-07-04 DIAGNOSIS — M545 Low back pain, unspecified: Secondary | ICD-10-CM | POA: Insufficient documentation

## 2022-07-04 DIAGNOSIS — R109 Unspecified abdominal pain: Secondary | ICD-10-CM | POA: Diagnosis present

## 2022-07-04 LAB — URINALYSIS, ROUTINE W REFLEX MICROSCOPIC
Bilirubin Urine: NEGATIVE
Glucose, UA: NEGATIVE mg/dL
Hgb urine dipstick: NEGATIVE
Ketones, ur: NEGATIVE mg/dL
Leukocytes,Ua: NEGATIVE
Nitrite: NEGATIVE
Protein, ur: NEGATIVE mg/dL
Specific Gravity, Urine: 1.02 (ref 1.005–1.030)
pH: 6.5 (ref 5.0–8.0)

## 2022-07-04 MED ORDER — NAPROXEN 500 MG PO TABS: 500.0000 mg | ORAL_TABLET | Freq: Two times a day (BID) | ORAL | 0 refills | Status: DC

## 2022-07-04 MED ORDER — IBUPROFEN 800 MG PO TABS
800.0000 mg | ORAL_TABLET | Freq: Once | ORAL | Status: AC
  Administered 2022-07-04: 800 mg via ORAL
  Filled 2022-07-04: qty 1

## 2022-07-04 NOTE — ED Provider Notes (Signed)
[facility] EMERGENCY DEPT  Provider Note  CSN: [id number] Arrival date & time: 07/04/22 0014  History Chief Complaint  Patient presents with   Flank Pain    Rodney Nunez is a 36 y.o. male with 2 days of L flank pain, radiating to LLQ, associated with urinary frequency. No dysuria, nausea, vomiting or penile discharge. No prior history of same. Pain comes and goes.    Home Medications Prior to Admission medications   Medication Sig Start Date End Date Taking? Authorizing Provider  naproxen (NAPROSYN) 500 MG tablet Take 1 tablet (500 mg total) by mouth 2 (two) times daily. 07/04/22  Yes Pollyann Savoy, MD  amLODipine (NORVASC) 5 MG tablet Take 5 mg by mouth daily.    [provider]  cyclobenzaprine (FLEXERIL) 10 MG tablet Take 1 tablet (10 mg total) by mouth at bedtime. Do NOT take within 8 hours of driving or operating heavy machinery. Recommend trialing this first at home to see if has SE's. 09/26/21   Madelyn Brunner, DO  SUMAtriptan Succinate (IMITREX PO) Take by mouth.    [provider]     Allergies    Penicillins   Review of Systems   Review of Systems Please see HPI for pertinent positives and negatives  Physical Exam BP 119/76   Pulse (!) 52   Temp 98.2 F (36.8 C) (Oral)   Resp 18   Ht 6' (1.829 m)   Wt 125.6 kg   SpO2 99%   BMI 37.57 kg/m   Physical Exam Vitals and nursing note reviewed.  Constitutional:      Appearance: Normal appearance.  HENT:     Head: Normocephalic and atraumatic.     Nose: Nose normal.     Mouth/Throat:     Mouth: Mucous membranes are moist.  Eyes:     Extraocular Movements: Extraocular movements intact.     Conjunctiva/sclera: Conjunctivae normal.  Cardiovascular:     Rate and Rhythm: Normal rate.  Pulmonary:     Effort: Pulmonary effort is normal.     Breath sounds: Normal breath sounds.  Abdominal:     General: Abdomen is flat.     Palpations: Abdomen is soft.     Tenderness:  There is no abdominal tenderness. There is no guarding.  Musculoskeletal:        General: Tenderness (L CVA) present. No swelling. Normal range of motion.     Cervical back: Neck supple.  Skin:    General: Skin is warm and dry.  Neurological:     General: No focal deficit present.     Mental Status: He is alert.  Psychiatric:        Mood and Affect: Mood normal.     ED Results / Procedures / Treatments   EKG None  Procedures Procedures  Medications Ordered in the ED Medications  ibuprofen (ADVIL) tablet 800 mg (800 mg Oral Given 07/04/22 0537)    Initial Impression and Plan  Patient with L flank pain, some colicky features. UA is clear. Will send for CT to eval renal stone. May be MSK as well.   ED Course   Clinical Course as of 07/04/22 0552  Tue Jul 04, 2022  8315 I personally viewed the images from radiology studies and agree with radiologist interpretation: CT is neg for kidney stones.   [CS]  0550 Patient resting comfortably. Suspect MSK pain. Plan discharge with rest, NSAIDs, heat and PCP follow up if not improving.  [CS]  Clinical Course User Index [CS] Pollyann Savoy, MD     MDM Rules/Calculators/A&P Medical Decision Making Problems Addressed: Acute left-sided low back pain without sciatica: acute illness or injury  Amount and/or Complexity of Data Reviewed Labs: ordered. Decision-making details documented in ED Course. Radiology: ordered and independent interpretation performed. Decision-making details documented in ED Course.  Risk Prescription drug management.    Final Clinical Impression(s) / ED Diagnoses Final diagnoses:  Acute left-sided low back pain without sciatica    Rx / DC Orders ED Discharge Orders          Ordered    naproxen (NAPROSYN) 500 MG tablet  2 times daily        07/04/22 0551             Pollyann Savoy, MD 07/04/22 [phone]

## 2022-07-04 NOTE — ED Triage Notes (Signed)
Pt presents to the ED with left sided flank pain that radiates into LLQ. Denies urinary symptoms, nausea, or vomiting.

## 2023-08-08 ENCOUNTER — Other Ambulatory Visit: Payer: Self-pay

## 2023-08-08 ENCOUNTER — Emergency Department (HOSPITAL_BASED_OUTPATIENT_CLINIC_OR_DEPARTMENT_OTHER)
Admission: EM | Admit: 2023-08-08 | Discharge: 2023-08-08 | Disposition: A | Discharge status: Home / Self Care | Payer: Commercial Managed Care - PPO | Attending: Emergency Medicine | Admitting: Emergency Medicine

## 2023-08-08 ENCOUNTER — Other Ambulatory Visit (HOSPITAL_BASED_OUTPATIENT_CLINIC_OR_DEPARTMENT_OTHER): Payer: Self-pay

## 2023-08-08 ENCOUNTER — Encounter (HOSPITAL_BASED_OUTPATIENT_CLINIC_OR_DEPARTMENT_OTHER): Payer: Self-pay | Admitting: Emergency Medicine

## 2023-08-08 DIAGNOSIS — R051 Acute cough: Secondary | ICD-10-CM | POA: Insufficient documentation

## 2023-08-08 DIAGNOSIS — Z1152 Encounter for screening for COVID-19: Secondary | ICD-10-CM | POA: Diagnosis not present

## 2023-08-08 DIAGNOSIS — R059 Cough, unspecified: Secondary | ICD-10-CM | POA: Diagnosis present

## 2023-08-08 DIAGNOSIS — J45909 Unspecified asthma, uncomplicated: Secondary | ICD-10-CM | POA: Diagnosis not present

## 2023-08-08 LAB — SARS CORONAVIRUS 2 BY RT PCR: SARS Coronavirus 2 by RT PCR: NEGATIVE

## 2023-08-08 MED ORDER — ALBUTEROL SULFATE HFA 108 (90 BASE) MCG/ACT IN AERS
2.0000 | INHALATION_SPRAY | Freq: Once | RESPIRATORY_TRACT | Status: AC
  Administered 2023-08-08: 2 via RESPIRATORY_TRACT
  Filled 2023-08-08: qty 6.7

## 2023-08-08 NOTE — ED Provider Notes (Signed)
  [place] EMERGENCY DEPARTMENT AT [facility] Provider Note   CSN: [id number] Arrival date & time: 08/08/23  1209     History  Chief Complaint  Patient presents with   Cough    SHLOMA HUGO is a 37 y.o. male.  The history is provided by the patient.   Patient presents for cough.  He reports he has had cough, congestion and fatigue for up to a week.  No fevers.  No chest pain.  No shortness of breath.  No hemoptysis He is a non-smoker.  Reports distant history of asthma.  Reports his work told him to come up here to get evaluated for COVID    Home Medications Prior to Admission medications   Medication Sig Start Date End Date Taking? Authorizing Provider  amLODipine (NORVASC) 5 MG tablet Take 5 mg by mouth daily.    [provider]  SUMAtriptan Succinate (IMITREX PO) Take by mouth.    [provider]      Allergies    Penicillins    Review of Systems   Review of Systems  Constitutional:  Positive for fatigue. Negative for fever.  Respiratory:  Positive for cough.   Cardiovascular:  Negative for chest pain.  Gastrointestinal:  Negative for vomiting.    Physical Exam Updated Vital Signs BP 118/82 (BP Location: Right Arm)   Pulse 76   Temp 98.5 F (36.9 C)   Resp 17   Ht 1.829 m (6')   Wt 122.5 kg   SpO2 99%   BMI 36.62 kg/m  Physical Exam CONSTITUTIONAL: Well developed/well nourished, coughs frequently during exam HEAD: Normocephalic/atraumatic EYES: EOMI/PERRL ENMT: Mucous membranes moist, uvula midline with mild erythema, no exudates, no angioedema, no stridor NECK: supple no meningeal signs SPINE/BACK:entire spine nontender CV: S1/S2 noted, no murmurs/rubs/gallops noted LUNGS: Lungs are clear to auscultation bilaterally, no apparent distress NEURO: Pt is awake/alert/appropriate, moves all extremitiesx4.  No facial droop.   EXTREMITIES: pulses normal/equal, full ROM SKIN: warm, color normal PSYCH: no abnormalities of  mood noted, alert and oriented to situation  ED Results / Procedures / Treatments   Labs (all labs ordered are listed, but only abnormal results are displayed) Labs Reviewed  SARS CORONAVIRUS 2 BY RT PCR    EKG None  Radiology No results found.  Procedures Procedures    Medications Ordered in ED Medications  albuterol (VENTOLIN HFA) 108 (90 Base) MCG/ACT inhaler 2 puff (2 puffs Inhalation Given 08/08/23 1338)    ED Course/ Medical Decision Making/ A&P                                 Medical Decision Making Risk Prescription drug management.   Well-appearing, no acute distress. Strong suspicion for viral illness.  COVID-negative.  No indication for x-ray.  Will provide albuterol for symptomatic relief.  No indication for antibiotics        Final Clinical Impression(s) / ED Diagnoses Final diagnoses:  Acute cough    Rx / DC Orders ED Discharge Orders     None         Zadie Rhine, MD 08/08/23 1346

## 2023-08-08 NOTE — ED Triage Notes (Signed)
Pt arrived POV caox4, ambulatory, NAD c/o productive cough, congestion, fatigue x6-7 days and states his job wanted him to come get tested for COVID.
